# Patient Record
Sex: Female | Born: 1955
Health system: Southern US, Community
[De-identification: ages and names within clinical notes are randomized; demographics above are authoritative.]

## PROBLEM LIST (undated history)

## (undated) DIAGNOSIS — G4700 Insomnia, unspecified: Secondary | ICD-10-CM

## (undated) DIAGNOSIS — M779 Enthesopathy, unspecified: Secondary | ICD-10-CM

## (undated) DIAGNOSIS — R609 Edema, unspecified: Secondary | ICD-10-CM

## (undated) DIAGNOSIS — Z9109 Other allergy status, other than to drugs and biological substances: Secondary | ICD-10-CM

## (undated) DIAGNOSIS — E509 Vitamin A deficiency, unspecified: Secondary | ICD-10-CM

## (undated) DIAGNOSIS — B029 Zoster without complications: Secondary | ICD-10-CM

## (undated) DIAGNOSIS — N959 Unspecified menopausal and perimenopausal disorder: Secondary | ICD-10-CM

## (undated) DIAGNOSIS — M810 Age-related osteoporosis without current pathological fracture: Secondary | ICD-10-CM

## (undated) DIAGNOSIS — E669 Obesity, unspecified: Secondary | ICD-10-CM

## (undated) DIAGNOSIS — R7303 Prediabetes: Secondary | ICD-10-CM

## (undated) DIAGNOSIS — T7840XA Allergy, unspecified, initial encounter: Secondary | ICD-10-CM

## (undated) HISTORY — DX: Enthesopathy, unspecified: M77.9

## (undated) HISTORY — DX: Age-related osteoporosis without current pathological fracture: M81.0

## (undated) HISTORY — DX: Allergy, unspecified, initial encounter: T78.40XA

## (undated) HISTORY — DX: Prediabetes: R73.03

## (undated) HISTORY — DX: Vitamin a deficiency, unspecified: E50.9

## (undated) HISTORY — PX: ABDOMINAL HYSTERECTOMY: SHX81

## (undated) HISTORY — PX: HERNIA REPAIR: SHX51

## (undated) HISTORY — DX: Insomnia, unspecified: G47.00

## (undated) HISTORY — DX: Edema, unspecified: R60.9

## (undated) HISTORY — PX: COLONOSCOPY: SHX174

## (undated) HISTORY — DX: Obesity, unspecified: E66.9

## (undated) HISTORY — DX: Unspecified menopausal and perimenopausal disorder: N95.9

---

## 2004-07-26 ENCOUNTER — Ambulatory Visit: Payer: Self-pay | Admitting: Unknown Physician Specialty

## 2004-09-17 LAB — HM COLONOSCOPY: HM COLON: NORMAL

## 2004-11-08 ENCOUNTER — Ambulatory Visit: Payer: Self-pay

## 2005-08-21 ENCOUNTER — Ambulatory Visit: Payer: Self-pay

## 2006-09-11 ENCOUNTER — Ambulatory Visit: Payer: Self-pay

## 2007-04-02 DIAGNOSIS — R9431 Abnormal electrocardiogram [ECG] [EKG]: Secondary | ICD-10-CM | POA: Insufficient documentation

## 2007-09-16 ENCOUNTER — Ambulatory Visit: Payer: Self-pay | Admitting: Family Medicine

## 2007-09-18 HISTORY — PX: BREAST EXCISIONAL BIOPSY: SUR124

## 2008-04-13 ENCOUNTER — Ambulatory Visit: Payer: Self-pay | Admitting: Surgery

## 2008-04-23 ENCOUNTER — Ambulatory Visit: Payer: Self-pay | Admitting: Surgery

## 2008-04-29 ENCOUNTER — Ambulatory Visit: Payer: Self-pay | Admitting: Surgery

## 2009-04-28 ENCOUNTER — Ambulatory Visit: Payer: Self-pay | Admitting: Family Medicine

## 2009-05-17 ENCOUNTER — Ambulatory Visit: Payer: Self-pay | Admitting: Family Medicine

## 2010-03-17 LAB — HM DEXA SCAN

## 2010-04-07 DIAGNOSIS — N63 Unspecified lump in unspecified breast: Secondary | ICD-10-CM | POA: Insufficient documentation

## 2010-04-13 ENCOUNTER — Ambulatory Visit: Payer: Self-pay | Admitting: Family Medicine

## 2010-05-03 ENCOUNTER — Ambulatory Visit: Payer: Self-pay | Admitting: Surgery

## 2010-05-18 HISTORY — PX: BREAST SURGERY: SHX581

## 2011-05-15 ENCOUNTER — Ambulatory Visit: Payer: Self-pay | Admitting: Family Medicine

## 2012-04-16 ENCOUNTER — Ambulatory Visit: Payer: Self-pay | Admitting: Family Medicine

## 2013-06-02 ENCOUNTER — Ambulatory Visit: Payer: Self-pay | Admitting: Family Medicine

## 2014-04-28 LAB — HM PAP SMEAR: HM Pap smear: NORMAL

## 2014-04-28 LAB — LIPID PANEL
Cholesterol: 197 mg/dL (ref 0–200)
HDL: 66 mg/dL (ref 35–70)
LDL Cholesterol: 119 mg/dL
Triglycerides: 62 mg/dL (ref 40–160)

## 2014-04-28 LAB — HEMOGLOBIN A1C: Hgb A1c MFr Bld: 5.7 % (ref 4.0–6.0)

## 2014-06-11 ENCOUNTER — Ambulatory Visit: Payer: Self-pay | Admitting: Family Medicine

## 2014-06-11 LAB — HM MAMMOGRAPHY: HM MAMMO: NORMAL

## 2015-03-30 ENCOUNTER — Telehealth: Payer: Self-pay | Admitting: Family Medicine

## 2015-03-30 NOTE — Telephone Encounter (Signed)
Recommended patient go to [facility] for Split in patient ear lobe.

## 2015-03-30 NOTE — Telephone Encounter (Signed)
Requesting a return call. Patient  is asking if you know a doctor that she can see for the split in her ear. Please return call [phone]

## 2015-04-29 ENCOUNTER — Encounter: Payer: Self-pay | Admitting: Family Medicine

## 2015-04-29 DIAGNOSIS — M1710 Unilateral primary osteoarthritis, unspecified knee: Secondary | ICD-10-CM | POA: Insufficient documentation

## 2015-04-29 DIAGNOSIS — G4700 Insomnia, unspecified: Secondary | ICD-10-CM | POA: Insufficient documentation

## 2015-04-29 DIAGNOSIS — E559 Vitamin D deficiency, unspecified: Secondary | ICD-10-CM | POA: Insufficient documentation

## 2015-04-29 DIAGNOSIS — N951 Menopausal and female climacteric states: Secondary | ICD-10-CM | POA: Insufficient documentation

## 2015-04-29 DIAGNOSIS — J302 Other seasonal allergic rhinitis: Secondary | ICD-10-CM | POA: Insufficient documentation

## 2015-04-29 DIAGNOSIS — J3089 Other allergic rhinitis: Secondary | ICD-10-CM

## 2015-04-29 DIAGNOSIS — R7303 Prediabetes: Secondary | ICD-10-CM | POA: Insufficient documentation

## 2015-04-29 DIAGNOSIS — E669 Obesity, unspecified: Secondary | ICD-10-CM | POA: Insufficient documentation

## 2015-04-29 DIAGNOSIS — R600 Localized edema: Secondary | ICD-10-CM | POA: Insufficient documentation

## 2015-05-02 ENCOUNTER — Encounter (INDEPENDENT_AMBULATORY_CARE_PROVIDER_SITE_OTHER): Payer: Self-pay

## 2015-05-02 ENCOUNTER — Encounter: Payer: Self-pay | Admitting: Family Medicine

## 2015-05-02 ENCOUNTER — Ambulatory Visit (INDEPENDENT_AMBULATORY_CARE_PROVIDER_SITE_OTHER): Payer: BLUE CROSS/BLUE SHIELD | Admitting: Family Medicine

## 2015-05-02 VITALS — BP 116/60 | HR 73 | Temp 98.1°F | Resp 14 | Ht 64.0 in | Wt 193.2 lb

## 2015-05-02 DIAGNOSIS — Z0000 Encounter for general adult medical examination without abnormal findings: Secondary | ICD-10-CM | POA: Diagnosis not present

## 2015-05-02 DIAGNOSIS — Z114 Encounter for screening for human immunodeficiency virus [HIV]: Secondary | ICD-10-CM

## 2015-05-02 DIAGNOSIS — Z7189 Other specified counseling: Secondary | ICD-10-CM | POA: Diagnosis not present

## 2015-05-02 DIAGNOSIS — M2012 Hallux valgus (acquired), left foot: Secondary | ICD-10-CM

## 2015-05-02 DIAGNOSIS — Z90710 Acquired absence of both cervix and uterus: Secondary | ICD-10-CM | POA: Insufficient documentation

## 2015-05-02 DIAGNOSIS — R7303 Prediabetes: Secondary | ICD-10-CM

## 2015-05-02 DIAGNOSIS — M25551 Pain in right hip: Secondary | ICD-10-CM | POA: Diagnosis not present

## 2015-05-02 DIAGNOSIS — R7309 Other abnormal glucose: Secondary | ICD-10-CM | POA: Diagnosis not present

## 2015-05-02 DIAGNOSIS — Z1211 Encounter for screening for malignant neoplasm of colon: Secondary | ICD-10-CM | POA: Diagnosis not present

## 2015-05-02 DIAGNOSIS — R600 Localized edema: Secondary | ICD-10-CM | POA: Diagnosis not present

## 2015-05-02 DIAGNOSIS — Z13220 Encounter for screening for lipoid disorders: Secondary | ICD-10-CM | POA: Diagnosis not present

## 2015-05-02 DIAGNOSIS — M21612 Bunion of left foot: Secondary | ICD-10-CM | POA: Insufficient documentation

## 2015-05-02 DIAGNOSIS — Z1239 Encounter for other screening for malignant neoplasm of breast: Secondary | ICD-10-CM

## 2015-05-02 DIAGNOSIS — E559 Vitamin D deficiency, unspecified: Secondary | ICD-10-CM

## 2015-05-02 DIAGNOSIS — Z719 Counseling, unspecified: Secondary | ICD-10-CM

## 2015-05-02 DIAGNOSIS — Z01419 Encounter for gynecological examination (general) (routine) without abnormal findings: Secondary | ICD-10-CM

## 2015-05-02 NOTE — Progress Notes (Signed)
Name: Kathy Benjamin   MRN: [id number]    DOB: 05/30/1956   Date:05/02/2015       Progress Note  Subjective  Chief Complaint  Chief Complaint  Patient presents with  . Annual Exam  . Hip Pain    right onset several months ago     HPI  Well Woman Exam: doing well, except for noticing right hip pain that is triggered by working and walking, but symptoms are not daily, improves with Aleve. She also has noticed bunion on the left foot  Patient Active Problem List   Diagnosis Date Noted  . Insomnia 04/29/2015  . Edema leg 04/29/2015  . Obesity (BMI 30.0-34.9) 04/29/2015  . Primary localized osteoarthrosis, lower leg 04/29/2015  . Perennial allergic rhinitis with seasonal variation 04/29/2015  . Borderline diabetes 04/29/2015  . Menopausal symptom 04/29/2015  . Vitamin D deficiency 04/29/2015  . Abnormal electrocardiogram 04/02/2007    Past Surgical History  Procedure Laterality Date  . Abdominal hysterectomy    . Breast surgery Right [id number]    biopsy  . Hernia repair      umbilical    Family History  Problem Relation Age of Onset  . Diabetes Mother   . Hypertension Mother   . Stroke Father   . Hypertension Father   . Cancer Father   . Hyperthyroidism Sister   . Cancer Brother     prostate  . Hyperthyroidism Brother     Social History   Social History  . Marital Status: Single    Spouse Name: N/A  . Number of Children: N/A  . Years of Education: N/A   Occupational History  . Not on file.   Social History Main Topics  . Smoking status: Never Smoker   . Smokeless tobacco: Never Used  . Alcohol Use: No  . Drug Use: No  . Sexual Activity: Not Currently   Other Topics Concern  . Not on file   Social History Narrative     Current outpatient prescriptions:  .  Biotin (BIOTIN 5000) 5 MG CAPS, Take 1 tablet by mouth daily., Disp: , Rfl:  .  BOSWELLIA-GLUCOSAMINE-VIT D PO, Take 1 tablet by mouth daily., Disp: , Rfl:  .  Calcium Carbonate-Vitamin D  (CALCIUM 600-D) 600-400 MG-UNIT per tablet, Take 1 tablet by mouth daily., Disp: , Rfl:  .  fluticasone (FLONASE) 50 MCG/ACT nasal spray, Place 2 sprays into both nostrils daily., Disp: , Rfl: 5 .  ibuprofen (ADVIL,MOTRIN) 800 MG tablet, Take 1 tablet by mouth as needed., Disp: , Rfl:  .  loratadine (CLARITIN) 10 MG tablet, Take 1 tablet by mouth as needed., Disp: , Rfl:  .  montelukast (SINGULAIR) 10 MG tablet, Take 1 tablet by mouth as needed., Disp: , Rfl:   No Known Allergies   ROS  Constitutional: Negative for fever or weight change.  Respiratory: Negative for cough and shortness of breath.   Cardiovascular: Negative for chest pain or palpitations.  Gastrointestinal: Negative for abdominal pain, no bowel changes.  Musculoskeletal: Negative for gait problem or joint swelling.  Skin: Negative for rash.  Neurological: Negative for dizziness or headache.  No other specific complaints in a complete review of systems (except as listed in HPI above).  Objective  Filed Vitals:   05/02/15 0833  BP: 116/60  Pulse: 73  Temp: 98.1 F (36.7 C)  TempSrc: Oral  Resp: 14  Height:  (1.626 m)  Weight: 193 lb 3.2 oz (87.635 kg)  SpO2: 97%  Body mass index is 33.15 kg/(m^2).  Physical Exam  Constitutional: Patient appears well-developed and well-nourished. No distress.  HENT: Head: Normocephalic and atraumatic. Ears: B TMs ok, no erythema or effusion; she has a ripped ear lobe from earring use, seeing ENT next week.  Nose: Nose normal. Mouth/Throat: Oropharynx is clear and moist. No oropharyngeal exudate.  Eyes: Conjunctivae and EOM are normal. Pupils are equal, round, and reactive to light. No scleral icterus.  Neck: Normal range of motion. Neck supple. No JVD present. No thyromegaly present.  Cardiovascular: Normal rate, regular rhythm and normal heart sounds.  No murmur heard. No BLE edema. Pulmonary/Chest: Effort normal and breath sounds normal. No respiratory  distress. Abdominal: Soft. Bowel sounds are normal, no distension. There is no tenderness. no masses Breast: scar on right upper breast, both breasts have some breast tissue thickening but no true lump felt, no nipple discharge or rashes FEMALE GENITALIA:  External genitalia normal External urethra normal Vaginal vault normal without discharge or lesions RECTAL:not done Musculoskeletal: Pain with external rotation of right hip,  no joint effusions. Bunion left foot Neurological: he is alert and oriented to person, place, and time. No cranial nerve deficit. Coordination, balance, strength, speech and gait are normal.  Skin: Skin is warm and dry. No rash noted. No erythema.  Psychiatric: Patient has a normal mood and affect. behavior is normal. Judgment and thought content normal.   PHQ2/9: Depression screen PHQ 2/9 05/02/2015  Decreased Interest 0  Down, Depressed, Hopeless 0  PHQ - 2 Score 0    Fall Risk: Fall Risk  05/02/2015  Falls in the past year? No     Assessment & Plan  1. Well woman exam   2. Health counseling Discussed importance of 150 minutes of physical activity weekly, eat two servings of fish weekly, eat one serving of tree nuts ( cashews, pistachios, pecans, almonds.[name]) every other day, eat 6 servings of fruit/vegetables daily and drink plenty of water and avoid sweet beverages.   3. Bilateral edema of lower extremity Stable, check labs - Comprehensive metabolic panel - CBC with Differential/Platelet  4. Borderline diabetes  - Hemoglobin A1c  5. Vitamin D deficiency  - Vit D  25 hydroxy (rtn osteoporosis monitoring)  6. Encounter for screening for HIV  - HIV antibody  7. Lipid screening  - Lipid panel  8. Breast cancer screening  - MM Digital Screening; Future  9. Colon cancer screening  - Ambulatory referral to Gastroenterology  10. Bunion of left foot Reassurance, not painful  11. Right hip pain Advised to avoid NSAID's and try Tylenol  instead for pain, likely OA, if symptoms gets worse we will check X-ray of hip

## 2015-05-02 NOTE — Patient Instructions (Signed)
Discussed importance of 150 minutes of physical activity weekly, eat two servings of fish weekly, eat one serving of tree nuts ( cashews, pistachios, pecans, almonds..) every other day, eat 6 servings of fruit/vegetables daily and drink plenty of water and avoid sweet beverages. 

## 2015-05-03 LAB — CBC WITH DIFFERENTIAL/PLATELET
BASOS: 1 %
Basophils Absolute: 0 10*3/uL (ref 0.0–0.2)
EOS (ABSOLUTE): 0.1 10*3/uL (ref 0.0–0.4)
Eos: 3 %
Hematocrit: 39.8 % (ref 34.0–46.6)
Hemoglobin: 12.9 g/dL (ref 11.1–15.9)
IMMATURE GRANS (ABS): 0 10*3/uL (ref 0.0–0.1)
IMMATURE GRANULOCYTES: 0 %
LYMPHS: 47 %
Lymphocytes Absolute: 2 10*3/uL (ref 0.7–3.1)
MCH: 29.7 pg (ref 26.6–33.0)
MCHC: 32.4 g/dL (ref 31.5–35.7)
MCV: 92 fL (ref 79–97)
Monocytes Absolute: 0.5 10*3/uL (ref 0.1–0.9)
Monocytes: 13 %
NEUTROS PCT: 36 %
Neutrophils Absolute: 1.4 10*3/uL (ref 1.4–7.0)
PLATELETS: 197 10*3/uL (ref 150–379)
RBC: 4.34 x10E6/uL (ref 3.77–5.28)
RDW: 13.5 % (ref 12.3–15.4)
WBC: 4.1 10*3/uL (ref 3.4–10.8)

## 2015-05-03 LAB — LIPID PANEL
CHOL/HDL RATIO: 2.9 ratio (ref 0.0–4.4)
Cholesterol, Total: 183 mg/dL (ref 100–199)
HDL: 64 mg/dL (ref >39)
LDL CALC: 102 mg/dL — AB (ref 0–99)
Triglycerides: 85 mg/dL (ref 0–149)
VLDL Cholesterol Cal: 17 mg/dL (ref 5–40)

## 2015-05-03 LAB — COMPREHENSIVE METABOLIC PANEL
A/G RATIO: 1.9 (ref 1.1–2.5)
ALT: 12 IU/L (ref 0–32)
AST: 16 IU/L (ref 0–40)
Albumin: 4.2 g/dL (ref 3.5–5.5)
Alkaline Phosphatase: 31 IU/L — ABNORMAL LOW (ref 39–117)
BUN/Creatinine Ratio: 19 (ref 9–23)
BUN: 13 mg/dL (ref 6–24)
Bilirubin Total: 1 mg/dL (ref 0.0–1.2)
CALCIUM: 9.3 mg/dL (ref 8.7–10.2)
CO2: 27 mmol/L (ref 18–29)
CREATININE: 0.68 mg/dL (ref 0.57–1.00)
Chloride: 104 mmol/L (ref 97–108)
GFR calc Af Amer: 112 mL/min/{1.73_m2} (ref >59)
GFR, EST NON AFRICAN AMERICAN: 97 mL/min/{1.73_m2} (ref >59)
Globulin, Total: 2.2 g/dL (ref 1.5–4.5)
Glucose: 90 mg/dL (ref 65–99)
POTASSIUM: 4.4 mmol/L (ref 3.5–5.2)
Sodium: 145 mmol/L — ABNORMAL HIGH (ref 134–144)
Total Protein: 6.4 g/dL (ref 6.0–8.5)

## 2015-05-03 LAB — VITAMIN D 25 HYDROXY (VIT D DEFICIENCY, FRACTURES): VIT D 25 HYDROXY: 33.1 ng/mL (ref 30.0–100.0)

## 2015-05-03 LAB — HIV ANTIBODY (ROUTINE TESTING W REFLEX): HIV SCREEN 4TH GENERATION: NONREACTIVE

## 2015-05-03 LAB — HEMOGLOBIN A1C
Est. average glucose Bld gHb Est-mCnc: 120 mg/dL
Hgb A1c MFr Bld: 5.8 % — ABNORMAL HIGH (ref 4.8–5.6)

## 2015-05-04 NOTE — Progress Notes (Signed)
Patient notified

## 2015-05-30 ENCOUNTER — Telehealth: Payer: Self-pay | Admitting: Gastroenterology

## 2015-05-30 NOTE — Telephone Encounter (Signed)
Please call patient for colonoscopy triage. Received referral from Dr Carlynn Purl on 05/02/15 - we attempted to call patient on 05/05/15 to do colonoscopy screening, but there was no answer and unable to leave a voicemail. Patient called today. She requests, if possible, to do her colonoscopy screening today and to call her at her work number [phone] because she is unable to use her cell phone at work. Thanks

## 2015-05-31 ENCOUNTER — Telehealth: Payer: Self-pay | Admitting: Gastroenterology

## 2015-05-31 ENCOUNTER — Other Ambulatory Visit: Payer: Self-pay

## 2015-05-31 NOTE — Telephone Encounter (Signed)
Gastroenterology Pre-Procedure Review  Request Date: 07-15-2015 Requesting Physician: Dr.Sowles  PATIENT REVIEW QUESTIONS: The patient responded to the following health history questions as indicated:    1. Are you having any GI issues? no 2. Do you have a personal history of Polyps? no 3. Do you have a family history of Colon Cancer or Polyps? no 4. Diabetes Mellitus? no 5. Joint replacements in the past 12 months?no 6. Major health problems in the past 3 months?no 7. Any artificial heart valves, MVP, or defibrillator?no    MEDICATIONS & ALLERGIES:    Patient reports the following regarding taking any anticoagulation/antiplatelet therapy:   Plavix, Coumadin, Eliquis, Xarelto, Lovenox, Pradaxa, Brilinta, or Effient? no Aspirin? no  Patient confirms/reports the following medications:  Current Outpatient Prescriptions  Medication Sig Dispense Refill   Biotin (BIOTIN 5000) 5 MG CAPS Take 1 tablet by mouth daily.     BOSWELLIA-GLUCOSAMINE-VIT D PO Take 1 tablet by mouth daily.     Calcium Carbonate-Vitamin D (CALCIUM 600-D) 600-400 MG-UNIT per tablet Take 1 tablet by mouth daily.     fluticasone (FLONASE) 50 MCG/ACT nasal spray Place 2 sprays into both nostrils daily.  5   ibuprofen (ADVIL,MOTRIN) 800 MG tablet Take 1 tablet by mouth as needed.     loratadine (CLARITIN) 10 MG tablet Take 1 tablet by mouth as needed.     montelukast (SINGULAIR) 10 MG tablet Take 1 tablet by mouth as needed.     No current facility-administered medications for this visit.    Patient confirms/reports the following allergies:  No Known Allergies  No orders of the defined types were placed in this encounter.    AUTHORIZATION INFORMATION Primary Insurance: 1D#: Group #:  Secondary Insurance: 1D#: Group #:  SCHEDULE INFORMATION: Date:  Time: Location:

## 2015-05-31 NOTE — Telephone Encounter (Signed)
Colon 07-15-2015 [facility] insurance

## 2015-06-13 ENCOUNTER — Ambulatory Visit
Admission: RE | Admit: 2015-06-13 | Discharge: 2015-06-13 | Disposition: A | Discharge status: Home / Self Care | Payer: BLUE CROSS/BLUE SHIELD | Source: Ambulatory Visit | Attending: Family Medicine | Admitting: Family Medicine

## 2015-06-13 DIAGNOSIS — Z1239 Encounter for other screening for malignant neoplasm of breast: Secondary | ICD-10-CM

## 2015-06-13 DIAGNOSIS — Z1231 Encounter for screening mammogram for malignant neoplasm of breast: Secondary | ICD-10-CM | POA: Diagnosis not present

## 2015-07-14 DIAGNOSIS — B029 Zoster without complications: Secondary | ICD-10-CM

## 2015-07-14 HISTORY — DX: Zoster without complications: B02.9

## 2015-07-19 ENCOUNTER — Telehealth: Payer: Self-pay | Admitting: Family Medicine

## 2015-07-19 NOTE — Telephone Encounter (Signed)
Patient is requesting a return call. States she has a rash on the back of her neck. Would like to know if she is able to take benadryl or if we are able to work her in this afternoon around 330

## 2015-07-20 ENCOUNTER — Encounter: Payer: Self-pay | Admitting: Family Medicine

## 2015-07-20 ENCOUNTER — Ambulatory Visit (INDEPENDENT_AMBULATORY_CARE_PROVIDER_SITE_OTHER): Payer: BLUE CROSS/BLUE SHIELD | Admitting: Family Medicine

## 2015-07-20 ENCOUNTER — Other Ambulatory Visit: Payer: Self-pay | Admitting: Family Medicine

## 2015-07-20 VITALS — BP 108/56 | HR 99 | Temp 99.5°F | Resp 16 | Ht 63.0 in | Wt 192.3 lb

## 2015-07-20 DIAGNOSIS — B029 Zoster without complications: Secondary | ICD-10-CM

## 2015-07-20 MED ORDER — VALACYCLOVIR HCL 1 G PO TABS: 1000.0000 mg | ORAL_TABLET | Freq: Three times a day (TID) | ORAL | Status: DC

## 2015-07-20 MED ORDER — HYDROCODONE-ACETAMINOPHEN 10-325 MG PO TABS: 1.0000 | ORAL_TABLET | Freq: Four times a day (QID) | ORAL | Status: DC | PRN

## 2015-07-20 NOTE — Progress Notes (Signed)
Name: Kathy HockeyDorothy Ann Hazelrigg   MRN: [id number]    DOB: 29-Jun-1956   Date:07/20/2015       Progress Note  Subjective  Chief Complaint  Chief Complaint  Patient presents with  . Rash    onset monday thought something bit her now her whole side of right neck is broke out into hair line.  Patient declines any itching or pain.  She does states she has a tightness in her neck.  blister like and raised    HPI  Shingles: she developed an itchy spot behind right ear 3 days ago. She thought it was a insect bite. The rash has spread to right side of neck and nuchal area, it is not painful, but itchy and some burning. Neck is feeling stiff and tender sometimes. She denies fever, she is feeling tired. No jaw pain, no change in appetite.   Patient Active Problem List   Diagnosis Date Noted  . Bunion of left foot 05/02/2015  . H/O: hysterectomy 05/02/2015  . Insomnia 04/29/2015  . Edema leg 04/29/2015  . Obesity (BMI 30.0-34.9) 04/29/2015  . Primary localized osteoarthrosis, lower leg 04/29/2015  . Perennial allergic rhinitis with seasonal variation 04/29/2015  . Borderline diabetes 04/29/2015  . Menopausal symptom 04/29/2015  . Vitamin D deficiency 04/29/2015  . Abnormal electrocardiogram 04/02/2007    Past Surgical History  Procedure Laterality Date  . Abdominal hysterectomy    . Breast surgery Right [id number]    biopsy  . Hernia repair      umbilical  . Breast biopsy Right 2009    neg    Family History  Problem Relation Age of Onset  . Diabetes Mother   . Hypertension Mother   . Stroke Father   . Hypertension Father   . Cancer Father   . Hyperthyroidism Sister   . Cancer Brother     prostate  . Hyperthyroidism Brother     Social History   Social History  . Marital Status: Single    Spouse Name: N/A  . Number of Children: N/A  . Years of Education: N/A   Occupational History  . Not on file.   Social History Main Topics  . Smoking status: Never Smoker   . Smokeless  tobacco: Never Used  . Alcohol Use: No  . Drug Use: No  . Sexual Activity: Not Currently   Other Topics Concern  . Not on file   Social History Narrative     Current outpatient prescriptions:  .  Biotin (BIOTIN 5000) 5 MG CAPS, Take 1 tablet by mouth daily., Disp: , Rfl:  .  BOSWELLIA-GLUCOSAMINE-VIT D PO, Take 1 tablet by mouth daily., Disp: , Rfl:  .  Calcium Carbonate-Vitamin D (CALCIUM 600-D) 600-400 MG-UNIT per tablet, Take 1 tablet by mouth daily., Disp: , Rfl:  .  fluticasone (FLONASE) 50 MCG/ACT nasal spray, Place 2 sprays into both nostrils daily., Disp: , Rfl: 5 .  HYDROcodone-acetaminophen (NORCO) 10-325 MG tablet, Take 1 tablet by mouth every 6 (six) hours as needed., Disp: 20 tablet, Rfl: 0 .  ibuprofen (ADVIL,MOTRIN) 800 MG tablet, Take 1 tablet by mouth as needed., Disp: , Rfl:  .  loratadine (CLARITIN) 10 MG tablet, Take 1 tablet by mouth as needed., Disp: , Rfl:  .  montelukast (SINGULAIR) 10 MG tablet, Take 1 tablet by mouth as needed., Disp: , Rfl:  .  valACYclovir (VALTREX) 1000 MG tablet, Take 1 tablet (1,000 mg total) by mouth 3 (three) times daily., Disp: 21 tablet, Rfl:  0  No Known Allergies   ROS  Ten systems reviewed and is negative except as mentioned in HPI   Objective  Filed Vitals:   07/20/15 1005  BP: 108/56  Pulse: 99  Temp: 99.5 F (37.5 C)  TempSrc: Oral  Resp: 16  Height: 5\' 3"  (1.6 m)  Weight: 192 lb 4.8 oz (87.227 kg)  SpO2: 97%    Body mass index is 34.07 kg/(m^2).  Physical Exam  Constitutional: Patient appears well-developed and well-nourished. Obese No distress.  HEENT: head atraumatic, normocephalic, pupils equal and reactive to light, ears normal bilaterally, neck supple, throat within normal limits Cardiovascular: Normal rate, regular rhythm and normal heart sounds.  No murmur heard. No BLE edema. Pulmonary/Chest: Effort normal and breath sounds normal. No respiratory distress. Abdominal: Soft.  There is no  tenderness. Psychiatric: Patient has a normal mood and affect. behavior is normal. Judgment and thought content normal. Skin: erythematous papular rash on right side of neck and nuchal area, some Blisters on posterior aspect - towards nuchal area, no oozing.   Recent Results (from the past 2160 hour(s))  HIV antibody     Status: None   Collection Time: 05/02/15  9:57 AM  Result Value Ref Range   HIV Screen 4th Generation wRfx Non Reactive Non Reactive  Vit D  25 hydroxy (rtn osteoporosis monitoring)     Status: None   Collection Time: 05/02/15  9:57 AM  Result Value Ref Range   Vit D, 25-Hydroxy 33.1 30.0 - 100.0 ng/mL    Comment: Vitamin D deficiency has been defined by the Institute of Medicine and an Endocrine Society practice guideline as a level of serum 25-OH vitamin D less than 20 ng/mL (1,2). The Endocrine Society went on to further define vitamin D insufficiency as a level between 21 and 29 ng/mL (2). 1. IOM (Institute of Medicine). 2010. Dietary reference    intakes for calcium and D. [place]: The    [employer]. 2. Holick MF, [place], Bischoff-Ferrari HA, et al.    Evaluation, treatment, and prevention of vitamin D    deficiency: an Endocrine Society clinical practice    guideline. JCEM. 2011 Jul; 96(7):1911-30.   Hemoglobin A1c     Status: Abnormal   Collection Time: 05/02/15  9:57 AM  Result Value Ref Range   Hgb A1c MFr Bld 5.8 (H) 4.8 - 5.6 %    Comment:          Pre-diabetes: 5.7 - 6.4          Diabetes: >6.4          Glycemic control for adults with diabetes: <7.0    Est. average glucose Bld gHb Est-mCnc 120 mg/dL  Lipid panel     Status: Abnormal   Collection Time: 05/02/15  9:57 AM  Result Value Ref Range   Cholesterol, Total 183 100 - 199 mg/dL   Triglycerides 85 0 - 149 mg/dL   HDL 64 >72 mg/dL    Comment: According to ATP-III Guidelines, HDL-C >59 mg/dL is considered a negative risk factor for CHD.    VLDL Cholesterol Cal 17 5 -  40 mg/dL   LDL Calculated 536 (H) 0 - 99 mg/dL   Chol/HDL Ratio 2.9 0.0 - 4.4 ratio units    Comment:                                   T.  Chol/HDL Ratio                                             Men  Women                               1/2 Avg.Risk  3.4    3.3                                   Avg.Risk  5.0    4.4                                2X Avg.Risk  9.6    7.1                                3X Avg.Risk 23.4   11.0   Comprehensive metabolic panel     Status: Abnormal   Collection Time: 05/02/15  9:57 AM  Result Value Ref Range   Glucose 90 65 - 99 mg/dL   BUN 13 6 - 24 mg/dL   Creatinine, Ser 9.60 0.57 - 1.00 mg/dL   GFR calc non Af Amer 97 >59 mL/min/1.73   GFR calc Af Amer 112 >59 mL/min/1.73   BUN/Creatinine Ratio 19 9 - 23   Sodium 145 (H) 134 - 144 mmol/L   Potassium 4.4 3.5 - 5.2 mmol/L   Chloride 104 97 - 108 mmol/L   CO2 27 18 - 29 mmol/L   Calcium 9.3 8.7 - 10.2 mg/dL   Total Protein 6.4 6.0 - 8.5 g/dL   Albumin 4.2 3.5 - 5.5 g/dL   Globulin, Total 2.2 1.5 - 4.5 g/dL   Albumin/Globulin Ratio 1.9 1.1 - 2.5   Bilirubin Total 1.0 0.0 - 1.2 mg/dL   Alkaline Phosphatase 31 (L) 39 - 117 IU/L   AST 16 0 - 40 IU/L   ALT 12 0 - 32 IU/L  CBC with Differential/Platelet     Status: None   Collection Time: 05/02/15  9:57 AM  Result Value Ref Range   WBC 4.1 3.4 - 10.8 x10E3/uL   RBC 4.34 3.77 - 5.28 x10E6/uL   Hemoglobin 12.9 11.1 - 15.9 g/dL   Hematocrit 45.4 09.8 - 46.6 %   MCV 92 79 - 97 fL   MCH 29.7 26.6 - 33.0 pg   MCHC 32.4 31.5 - 35.7 g/dL   RDW 11.9 14.7 - 82.9 %   Platelets 197 150 - 379 x10E3/uL   Neutrophils 36 %   Lymphs 47 %   Monocytes 13 %   Eos 3 %   Basos 1 %   Neutrophils Absolute 1.4 1.4 - 7.0 x10E3/uL   Lymphocytes Absolute 2.0 0.7 - 3.1 x10E3/uL   Monocytes Absolute 0.5 0.1 - 0.9 x10E3/uL   EOS (ABSOLUTE) 0.1 0.0 - 0.4 x10E3/uL   Basophils Absolute 0.0 0.0 - 0.2 x10E3/uL   Immature Granulocytes 0 %   Immature Grans (Abs) 0.0 0.0 - 0.1  x10E3/uL    PHQ2/9: Depression screen [facility] 2/9 07/20/2015 05/02/2015  Decreased Interest 0 0  Down, Depressed, Hopeless 0 0  PHQ - 2 Score 0 0  Fall Risk: Fall Risk  07/20/2015 05/02/2015  Falls in the past year? No No    Functional Status Survey: Is the patient deaf or have difficulty hearing?: No Does the patient have difficulty seeing, even when wearing glasses/contacts?: Yes (glasses) Does the patient have difficulty concentrating, remembering, or making decisions?: No Does the patient have difficulty walking or climbing stairs?: No Does the patient have difficulty dressing or bathing?: No Does the patient have difficulty doing errands alone such as visiting a doctor's office or shopping?: No    Assessment & Plan  1. Shingles outbreak  We will treat with Valtrex, pain medication to take prn . Discussed importance of avoiding visiting newborns or anyone that is immunosuppressed. Shingles shot in 5  years - valACYclovir (VALTREX) 1000 MG tablet; Take 1 tablet (1,000 mg total) by mouth 3 (three) times daily.  Dispense: 21 tablet; Refill: 0 - HYDROcodone-acetaminophen (NORCO) 10-325 MG tablet; Take 1 tablet by mouth every 6 (six) hours as needed.  Dispense: 20 tablet; Refill: 0

## 2015-07-20 NOTE — Telephone Encounter (Signed)
Patient was notified and is coming in for a appointment today.

## 2015-07-20 NOTE — Patient Instructions (Signed)

## 2015-07-25 ENCOUNTER — Telehealth: Payer: Self-pay | Admitting: Family Medicine

## 2015-07-25 NOTE — Telephone Encounter (Signed)
Was diagnosed with shingles on 07-20-15 and would like to know if it was okay to put any cream on her face. Please return call [phone]

## 2015-07-26 NOTE — Telephone Encounter (Signed)
Cream for pain? What is exactly her question? What type of cream?

## 2015-07-27 ENCOUNTER — Telehealth: Payer: Self-pay

## 2015-07-27 NOTE — Telephone Encounter (Signed)
Left voicemial to call us back

## 2015-07-27 NOTE — Telephone Encounter (Signed)
Patient stated cortisone cream and I told her that would be fine.

## 2015-07-27 NOTE — Telephone Encounter (Signed)
Dr. Carlynn PurlSowles, please advise if you think your pt will still be able to have her colonoscopy on 08/05/15 because of her shingles. If you feel we should put this off a few more weeks, just let me know and I will call her. Thank you!

## 2015-07-27 NOTE — Telephone Encounter (Signed)
Patient has a colonoscopy scheduled for 08/05/2015. However, she stated that she spoke to you about her having Shingles. She stated that she is done with her antibiotics but wanted to know if she is okay to proceed with the colonoscopy or not. She wanted for you to call her for further questions.  Patient also wanted to know if we could mail her the instructions for prepping since she she had lost the one that was given. I told her that I would.

## 2015-07-28 ENCOUNTER — Encounter: Payer: Self-pay | Admitting: *Deleted

## 2015-07-28 NOTE — Telephone Encounter (Signed)
She should be able to have it done - as long as all lesion have crusted over

## 2015-07-29 NOTE — Telephone Encounter (Addendum)
Spoke with pt and advised her of what Dr. Carlynn PurlSowles had said. She will keep her appt on 08/05/15, but call me back on Wednesday, Nov 16th to let me know how the area looks. Will continue Cortisone cream as directed by Dr. Carlynn PurlSowles.  If area is not crusted over then we will change appt.

## 2015-07-29 NOTE — Telephone Encounter (Signed)
LVM for Kathy Benjamin to return my call on Kathy Benjamin's home and cell phone regarding Dr Carlynn PurlSowles recommendations. Asked Kathy Benjamin to return my call.

## 2015-08-04 ENCOUNTER — Telehealth: Payer: Self-pay | Admitting: Family Medicine

## 2015-08-04 MED ORDER — ACYCLOVIR 5 % EX OINT: 1.0000 "application " | TOPICAL_OINTMENT | CUTANEOUS | Status: DC

## 2015-08-04 NOTE — Discharge Instructions (Signed)

## 2015-08-04 NOTE — Telephone Encounter (Signed)
Was diagnosed with shingles and they are getting better however she is still doing a lot of itching. Would like to know what can she do to calm it down. She has tried cortizone cream. Please reroute to one of the other ladies I will be off tomorrow.

## 2015-08-04 NOTE — Telephone Encounter (Signed)
Sent prescription for antiviral cream

## 2015-08-05 ENCOUNTER — Ambulatory Visit: Payer: BLUE CROSS/BLUE SHIELD | Admitting: Anesthesiology

## 2015-08-05 ENCOUNTER — Encounter
Admission: RE | Disposition: A | Discharge status: Home / Self Care | Payer: Self-pay | Source: Ambulatory Visit | Attending: Gastroenterology

## 2015-08-05 ENCOUNTER — Ambulatory Visit
Admission: RE | Admit: 2015-08-05 | Discharge: 2015-08-05 | Disposition: A | Discharge status: Home / Self Care | Payer: BLUE CROSS/BLUE SHIELD | Source: Ambulatory Visit | Attending: Gastroenterology | Admitting: Gastroenterology

## 2015-08-05 DIAGNOSIS — R7303 Prediabetes: Secondary | ICD-10-CM | POA: Insufficient documentation

## 2015-08-05 DIAGNOSIS — Z833 Family history of diabetes mellitus: Secondary | ICD-10-CM | POA: Diagnosis not present

## 2015-08-05 DIAGNOSIS — Z809 Family history of malignant neoplasm, unspecified: Secondary | ICD-10-CM | POA: Diagnosis not present

## 2015-08-05 DIAGNOSIS — Z780 Asymptomatic menopausal state: Secondary | ICD-10-CM | POA: Insufficient documentation

## 2015-08-05 DIAGNOSIS — Z79899 Other long term (current) drug therapy: Secondary | ICD-10-CM | POA: Insufficient documentation

## 2015-08-05 DIAGNOSIS — E509 Vitamin A deficiency, unspecified: Secondary | ICD-10-CM | POA: Insufficient documentation

## 2015-08-05 DIAGNOSIS — E669 Obesity, unspecified: Secondary | ICD-10-CM | POA: Insufficient documentation

## 2015-08-05 DIAGNOSIS — K641 Second degree hemorrhoids: Secondary | ICD-10-CM | POA: Diagnosis not present

## 2015-08-05 DIAGNOSIS — Z8249 Family history of ischemic heart disease and other diseases of the circulatory system: Secondary | ICD-10-CM | POA: Diagnosis not present

## 2015-08-05 DIAGNOSIS — Z1211 Encounter for screening for malignant neoplasm of colon: Secondary | ICD-10-CM | POA: Diagnosis not present

## 2015-08-05 DIAGNOSIS — Z90710 Acquired absence of both cervix and uterus: Secondary | ICD-10-CM | POA: Diagnosis not present

## 2015-08-05 DIAGNOSIS — M810 Age-related osteoporosis without current pathological fracture: Secondary | ICD-10-CM | POA: Diagnosis not present

## 2015-08-05 HISTORY — DX: Zoster without complications: B02.9

## 2015-08-05 HISTORY — PX: COLONOSCOPY WITH PROPOFOL: SHX5780

## 2015-08-05 HISTORY — DX: Other allergy status, other than to drugs and biological substances: Z91.09

## 2015-08-05 SURGERY — COLONOSCOPY WITH PROPOFOL
Anesthesia: Monitor Anesthesia Care | Wound class: Contaminated

## 2015-08-05 MED ORDER — PROMETHAZINE HCL 25 MG/ML IJ SOLN: 6.2500 mg | INTRAMUSCULAR | Status: DC | PRN

## 2015-08-05 MED ORDER — PROPOFOL 10 MG/ML IV BOLUS
INTRAVENOUS | Status: DC | PRN
  Administered 2015-08-05: 50 mg via INTRAVENOUS
  Administered 2015-08-05: 100 mg via INTRAVENOUS
  Administered 2015-08-05: 30 mg via INTRAVENOUS
  Administered 2015-08-05: 20 mg via INTRAVENOUS
  Administered 2015-08-05: 50 mg via INTRAVENOUS
  Administered 2015-08-05: 30 mg via INTRAVENOUS

## 2015-08-05 MED ORDER — LIDOCAINE HCL (CARDIAC) 20 MG/ML IV SOLN
INTRAVENOUS | Status: DC | PRN
  Administered 2015-08-05: 40 mg via INTRAVENOUS

## 2015-08-05 MED ORDER — LACTATED RINGERS IV SOLN
INTRAVENOUS | Status: DC
  Administered 2015-08-05: 10:00:00 via INTRAVENOUS

## 2015-08-05 SURGICAL SUPPLY — 28 items

## 2015-08-05 NOTE — H&P (Signed)
[facility]  [address] [place], [place] [address] Phone: [phone] Fax : [phone]  Primary Care Physician:  Ruel Favors, MD Primary Gastroenterologist:  Dr. Servando Snare  Pre-Procedure History & Physical: HPI:  Kathy Benjamin is a 59 y.o. female is here for a screening colonoscopy.   Past Medical History  Diagnosis Date  . Insomnia   . Menopausal and perimenopausal disorder   . Obesity   . Allergy   . Pre-diabetes   . Vitamin A deficiency   . Osteoporosis   . Tendonitis   . Edema   . Shingles 07/14/15    right ear and neck.  Completed valtrex. scabbing over  . Environmental allergies     Past Surgical History  Procedure Laterality Date  . Abdominal hysterectomy    . Breast surgery Right [id number]    biopsy  . Hernia repair      umbilical  . Breast biopsy Right 2009    neg  . Colonoscopy      Prior to Admission medications   Medication Sig Start Date End Date Taking? Authorizing Provider  acyclovir ointment (ZOVIRAX) 5 % Apply 1 application topically every 4 (four) hours. 08/04/15  Yes Alba Cory, MD  Biotin (BIOTIN 5000) 5 MG CAPS Take 1 tablet by mouth daily.   Yes Historical Provider, MD  BOSWELLIA-GLUCOSAMINE-VIT D PO Take 1 tablet by mouth daily.   Yes Historical Provider, MD  Calcium Carbonate-Vitamin D (CALCIUM 600-D) 600-400 MG-UNIT per tablet Take 1 tablet by mouth daily. 04/06/09  Yes Historical Provider, MD  fluticasone (FLONASE) 50 MCG/ACT nasal spray Place 2 sprays into both nostrils daily. 04/19/15  Yes Historical Provider, MD  HYDROcodone-acetaminophen (NORCO) 10-325 MG tablet Take 1 tablet by mouth every 6 (six) hours as needed. 07/20/15  Yes Alba Cory, MD  ibuprofen (ADVIL,MOTRIN) 800 MG tablet Take 1 tablet by mouth as needed. 04/28/14  Yes Historical Provider, MD  loratadine (CLARITIN) 10 MG tablet Take 1 tablet by mouth as needed. 01/11/15  Yes Historical Provider, MD  montelukast (SINGULAIR) 10 MG tablet Take 1  tablet by mouth as needed. 01/11/15  Yes Historical Provider, MD  valACYclovir (VALTREX) 1000 MG tablet Take 1 tablet (1,000 mg total) by mouth 3 (three) times daily. 07/20/15   Alba Cory, MD    Allergies as of 05/31/2015  . (No Known Allergies)    Family History  Problem Relation Age of Onset  . Diabetes Mother   . Hypertension Mother   . Stroke Father   . Hypertension Father   . Cancer Father   . Hyperthyroidism Sister   . Cancer Brother     prostate  . Hyperthyroidism Brother     Social History   Social History  . Marital Status: Single    Spouse Name: N/A  . Number of Children: N/A  . Years of Education: N/A   Occupational History  . Not on file.   Social History Main Topics  . Smoking status: Never Smoker   . Smokeless tobacco: Never Used  . Alcohol Use: No  . Drug Use: No  . Sexual Activity: Not Currently   Other Topics Concern  . Not on file   Social History Narrative    Review of Systems: See HPI, otherwise negative ROS  Physical Exam: BP 126/78 mmHg  Pulse 54  Temp(Src) 97.9 F (36.6 C) (Temporal)  Resp 16  Ht  (1.6 m)  Wt 185 lb (83.915 kg)  BMI 32.78 kg/m2  SpO2 100%  General:   Alert,  pleasant and cooperative in NAD Head:  Normocephalic and atraumatic. Neck:  Supple; no masses or thyromegaly. Lungs:  Clear throughout to auscultation.    Heart:  Regular rate and rhythm. Abdomen:  Soft, nontender and nondistended. Normal bowel sounds, without guarding, and without rebound.   Neurologic:  Alert and  oriented x4;  grossly normal neurologically.  Impression/Plan: Kathy Benjamin is now here to undergo a screening colonoscopy.  Risks, benefits, and alternatives regarding colonoscopy have been reviewed with the patient.  Questions have been answered.  All parties agreeable.

## 2015-08-05 NOTE — Anesthesia Postprocedure Evaluation (Signed)
  Anesthesia Post-op Note  Patient: Kathy Benjamin  Procedure(s) Performed: Procedure(s): COLONOSCOPY WITH PROPOFOL (N/A)  Anesthesia type:MAC  Patient location: PACU  Post pain: Pain level controlled  Post assessment: Post-op Vital signs reviewed, Patient's Cardiovascular Status Stable, Respiratory Function Stable, Patent Airway and No signs of Nausea or vomiting  Post vital signs: Reviewed and stable  Last Vitals:  Filed Vitals:   08/05/15 1007  BP:   Pulse: 70  Temp:   Resp: 20    Level of consciousness: awake, alert  and patient cooperative  Complications: No apparent anesthesia complications

## 2015-08-05 NOTE — Transfer of Care (Signed)
Immediate Anesthesia Transfer of Care Note  Patient: Kathy CanavanDorothy Ann Welge  Procedure(s) Performed: Procedure(s): COLONOSCOPY WITH PROPOFOL (N/A)  Patient Location: PACU  Anesthesia Type: MAC  Level of Consciousness: awake, alert  and patient cooperative  Airway and Oxygen Therapy: Patient Spontanous Breathing and Patient connected to supplemental oxygen  Post-op Assessment: Post-op Vital signs reviewed, Patient's Cardiovascular Status Stable, Respiratory Function Stable, Patent Airway and No signs of Nausea or vomiting  Post-op Vital Signs: Reviewed and stable  Complications: No apparent anesthesia complications

## 2015-08-05 NOTE — Telephone Encounter (Signed)
Patient notified

## 2015-08-05 NOTE — Anesthesia Preprocedure Evaluation (Signed)
Anesthesia Evaluation  Patient identified by MRN, date of birth, ID band Patient awake    Reviewed: Allergy & Precautions, NPO status , Patient's Chart, lab work & pertinent test results  Airway Mallampati: II  TM Distance: >3 FB Neck ROM: Full    Dental no notable dental hx.    Pulmonary neg pulmonary ROS,    Pulmonary exam normal breath sounds clear to auscultation       Cardiovascular negative cardio ROS Normal cardiovascular exam Rhythm:Regular Rate:Normal     Neuro/Psych negative neurological ROS  negative psych ROS   GI/Hepatic negative GI ROS, Neg liver ROS,   Endo/Other  negative endocrine ROS  Renal/GU negative Renal ROS  negative genitourinary   Musculoskeletal negative musculoskeletal ROS (+) Arthritis , osteoporosis   Abdominal   Peds negative pediatric ROS (+)  Hematology negative hematology ROS (+)   Anesthesia Other Findings Pt with shingles.  Completed Valtrex 3 weeks ago.  She has localized and dry skin changes.  No fluid apparent.  She is immunocompetent.  We have covered and will proceed with colonoscopy.  Reproductive/Obstetrics negative OB ROS                             Anesthesia Physical Anesthesia Plan  ASA: II  Anesthesia Plan: MAC   Post-op Pain Management:    Induction: Intravenous  Airway Management Planned:   Additional Equipment:   Intra-op Plan:   Post-operative Plan: Extubation in OR  Informed Consent: I have reviewed the patients History and Physical, chart, labs and discussed the procedure including the risks, benefits and alternatives for the proposed anesthesia with the patient or authorized representative who has indicated his/her understanding and acceptance.   Dental advisory given  Plan Discussed with: CRNA  Anesthesia Plan Comments:         Anesthesia Quick Evaluation

## 2015-08-05 NOTE — Op Note (Signed)
[facility] Patient Name: Kathy Benjamin Procedure Date: 08/05/2015 9:40 AM MRN: [id number] Account #: [id number] Date of Birth: 1955/09/23 Admit Type: Outpatient Age: 59 Room: [facility] OR ROOM 01 Gender: Female Note Status: Finalized Procedure:         Colonoscopy Indications:       Screening for colorectal malignant neoplasm Providers:         Midge Minium, MD Referring MD:      Onnie Boer. Sowles, MD (Referring MD) Medicines:         Propofol per Anesthesia Complications:     No immediate complications. Procedure:         Pre-Anesthesia Assessment:                    - Prior to the procedure, a History and Physical was                     performed, and patient medications and allergies were                     reviewed. The patient's tolerance of previous anesthesia                     was also reviewed. The risks and benefits of the procedure                     and the sedation options and risks were discussed with the                     patient. All questions were answered, and informed consent                     was obtained. Prior Anticoagulants: The patient has taken                     no previous anticoagulant or antiplatelet agents. ASA                     Grade Assessment: II - A patient with mild systemic                     disease. After reviewing the risks and benefits, the                     patient was deemed in satisfactory condition to undergo                     the procedure.                    After obtaining informed consent, the colonoscope was                     passed under direct vision. Throughout the procedure, the                     patient's blood pressure, pulse, and oxygen saturations                     were monitored continuously. The Olympus CF H180AL                     colonoscope (S#: [id number]) was introduced through the anus  and advanced to the the cecum, identified by appendiceal                   orifice and ileocecal valve. The colonoscopy was performed                     without difficulty. The patient tolerated the procedure                     well. The quality of the bowel preparation was excellent. Findings:      The perianal and digital rectal examinations were normal.      Non-bleeding internal hemorrhoids were found during retroflexion. The       hemorrhoids were Grade II (internal hemorrhoids that prolapse but reduce       spontaneously). Impression:        - Non-bleeding internal hemorrhoids.                    - No specimens collected. Recommendation:    - Repeat colonoscopy in 10 years for screening unless any                     change in family history or lower GI problems. Procedure Code(s): --- Professional ---                    45378, Colonoscopy, [id number] [phone]; diagnostic, including                     collection of specimen(s) by brushing or washing, when                     performed (separate procedure) Diagnosis Code(s): --- Professional ---                    Z12.11, Encounter for screening for malignant neoplasm of                     colon CPT copyright 2014 American Medical Association. All rights reserved. The codes documented in this report are preliminary and upon coder review may  be revised to meet current compliance requirements. Midge Miniumarren Tuwanda Vokes, MD 08/05/2015 9:56:33 AM This report has been signed electronically. Number of Addenda: 0 Note Initiated On: 08/05/2015 9:40 AM Scope Withdrawal Time: 0 hours 6 minutes 17 seconds  Total Procedure Duration: 0 hours 12 minutes 39 seconds       [facility]

## 2015-08-05 NOTE — Anesthesia Procedure Notes (Signed)
Procedure Name: MAC Performed by: Moe Brier Pre-anesthesia Checklist: Patient identified, Emergency Drugs available, Suction available, Timeout performed and Patient being monitored Patient Re-evaluated:Patient Re-evaluated prior to inductionOxygen Delivery Method: Nasal cannula Placement Confirmation: positive ETCO2     

## 2015-08-08 ENCOUNTER — Encounter: Payer: Self-pay | Admitting: Gastroenterology

## 2015-08-08 ENCOUNTER — Telehealth: Payer: Self-pay | Admitting: Family Medicine

## 2015-08-08 NOTE — Telephone Encounter (Signed)
Pt states she came in a few weeks ago and states she was diagnosed with shingles. Pt states she is currently on Acyclovir, ointment. Pt states this is causing her to burn and itch. Please advise.

## 2015-08-09 ENCOUNTER — Ambulatory Visit (INDEPENDENT_AMBULATORY_CARE_PROVIDER_SITE_OTHER): Payer: BLUE CROSS/BLUE SHIELD | Admitting: Family Medicine

## 2015-08-09 ENCOUNTER — Encounter: Payer: Self-pay | Admitting: Family Medicine

## 2015-08-09 VITALS — BP 118/64 | HR 94 | Temp 98.2°F | Resp 18 | Ht 63.0 in | Wt 189.0 lb

## 2015-08-09 DIAGNOSIS — B029 Zoster without complications: Secondary | ICD-10-CM | POA: Insufficient documentation

## 2015-08-09 DIAGNOSIS — B028 Zoster with other complications: Secondary | ICD-10-CM | POA: Diagnosis not present

## 2015-08-09 MED ORDER — GABAPENTIN 100 MG PO CAPS: 100.0000 mg | ORAL_CAPSULE | Freq: Three times a day (TID) | ORAL | Status: DC

## 2015-08-09 NOTE — Progress Notes (Signed)
Name: Kathy Benjamin   MRN: [id number]    DOB: 10-Jul-1956   Date:08/09/2015       Progress Note  Subjective  Chief Complaint  Chief Complaint  Patient presents with  . Acute Visit    Shingles meds burning (Dr. Carlynn Purl pt)  . Herpes Zoster    Still having burning, itching and right neck soreness. Patient finished her Valtrex and still has the rashs. Dr. Carlynn Purl prescribed gave her cream Zovirax but it made her shingles spots burn.    HPI  Pt. Follows up for an episode of shingles diagnosed three weeks ago. She was treated with Valtrex 1000 mg three times daily and Hydrocodone 10-325 mg every 6 hours as needed for pain relief. She reports the rash has subsided to some extent, pain is still there (itching and burning especially after she applied Acyclovir ointment to her neck).  Reports Hydrocodone 10 mg 'knocks me out.'    Past Medical History  Diagnosis Date  . Insomnia   . Menopausal and perimenopausal disorder   . Obesity   . Allergy   . Pre-diabetes   . Vitamin A deficiency   . Osteoporosis   . Tendonitis   . Edema   . Shingles 07/14/15    right ear and neck.  Completed valtrex. scabbing over  . Environmental allergies     Past Surgical History  Procedure Laterality Date  . Abdominal hysterectomy    . Breast surgery Right [id number]    biopsy  . Hernia repair      umbilical  . Breast biopsy Right 2009    neg  . Colonoscopy    . Colonoscopy with propofol N/A 08/05/2015    Procedure: COLONOSCOPY WITH PROPOFOL;  Surgeon: Midge Minium, MD;  [facility];  Service: Endoscopy;  Laterality: N/A;    Family History  Problem Relation Age of Onset  . Diabetes Mother   . Hypertension Mother   . Stroke Father   . Hypertension Father   . Cancer Father   . Hyperthyroidism Sister   . Cancer Brother     prostate  . Hyperthyroidism Brother     Social History   Social History  . Marital Status: Single    Spouse Name: N/A  . Number of Children: N/A    . Years of Education: N/A   Occupational History  . Not on file.   Social History Main Topics  . Smoking status: Never Smoker   . Smokeless tobacco: Never Used  . Alcohol Use: No  . Drug Use: No  . Sexual Activity: Not Currently   Other Topics Concern  . Not on file   Social History Narrative     Current outpatient prescriptions:  .  acyclovir ointment (ZOVIRAX) 5 %, Apply 1 application topically every 4 (four) hours., Disp: 60 g, Rfl: 0 .  Biotin (BIOTIN 5000) 5 MG CAPS, Take 1 tablet by mouth daily., Disp: , Rfl:  .  BOSWELLIA-GLUCOSAMINE-VIT D PO, Take 1 tablet by mouth daily., Disp: , Rfl:  .  Calcium Carbonate-Vitamin D (CALCIUM 600-D) 600-400 MG-UNIT per tablet, Take 1 tablet by mouth daily., Disp: , Rfl:  .  fluticasone (FLONASE) 50 MCG/ACT nasal spray, Place 2 sprays into both nostrils daily., Disp: , Rfl: 5 .  HYDROcodone-acetaminophen (NORCO) 10-325 MG tablet, Take 1 tablet by mouth every 6 (six) hours as needed., Disp: 20 tablet, Rfl: 0 .  ibuprofen (ADVIL,MOTRIN) 800 MG tablet, Take 1 tablet by mouth as needed., Disp: , Rfl:  .  loratadine (CLARITIN) 10 MG tablet, Take 1 tablet by mouth as needed., Disp: , Rfl:  .  montelukast (SINGULAIR) 10 MG tablet, Take 1 tablet by mouth as needed., Disp: , Rfl:   No Known Allergies   Review of Systems  Constitutional: Negative for fever and chills.  Skin: Positive for itching and rash.     Objective  Filed Vitals:   08/09/15 1504  BP: 118/64  Pulse: 94  Temp: 98.2 F (36.8 C)  TempSrc: Oral  Resp: 18  Height: 5\' 3"  (1.6 m)  Weight: 189 lb (85.73 kg)  SpO2: 96%    Physical Exam  Constitutional: She is well-developed, well-nourished, and in no distress.  Skin: Skin is warm and dry. Rash noted. Rash is maculopapular. There is erythema.     Erythematous, macular rash with interspersed healing blisters on right side of neck and around the right ear with erythema on the right ear lobe.  Nursing note and vitals  reviewed.    Assessment & Plan  1. Herpes zoster of neck Patient adequately treated with Valtrex and hydrocodone. Symptoms now resolving but itching at the affected area is still present. DC acyclovir ointment and start on low gabapentin 100 mg 3 times daily. Patient advised to take one half of hydrocodone 10 mg twice a day as needed for pain relief. Follow-up with PCP in 1 week. - gabapentin (NEURONTIN) 100 MG capsule; Take 1 capsule (100 mg total) by mouth 3 (three) times daily.  Dispense: 30 capsule; Refill: 0   Shonette Rhames Asad A. Faylene KurtzShah [facility] 08/09/2015 3:21 PM

## 2015-08-10 NOTE — Telephone Encounter (Signed)
She has post-herpetic neuralgia and needs to be seen

## 2015-08-10 NOTE — Telephone Encounter (Signed)
Patient was seen yesterday by Dr. Sherryll BurgerShah.

## 2015-08-15 ENCOUNTER — Ambulatory Visit (INDEPENDENT_AMBULATORY_CARE_PROVIDER_SITE_OTHER): Payer: BLUE CROSS/BLUE SHIELD | Admitting: Family Medicine

## 2015-08-15 ENCOUNTER — Encounter: Payer: Self-pay | Admitting: Family Medicine

## 2015-08-15 DIAGNOSIS — B0229 Other postherpetic nervous system involvement: Secondary | ICD-10-CM | POA: Diagnosis not present

## 2015-08-15 MED ORDER — LIDOCAINE 5 % EX PTCH: 1.0000 | MEDICATED_PATCH | CUTANEOUS | Status: DC

## 2015-08-15 MED ORDER — PREDNISONE 5 MG (48) PO TBPK: 5.0000 mg | ORAL_TABLET | Freq: Every day | ORAL | Status: DC

## 2015-08-15 MED ORDER — GABAPENTIN 100 MG PO CAPS: 100.0000 mg | ORAL_CAPSULE | Freq: Three times a day (TID) | ORAL | Status: DC

## 2015-08-15 NOTE — Progress Notes (Signed)
Name: Kathy Benjamin   MRN: [id number]    DOB: 25-Dec-1955   Date:08/15/2015       Progress Note  Subjective  Chief Complaint  Chief Complaint  Patient presents with  . Follow-up    1 week  . Herpes Zoster    healing well still having some itching and burning    HPI  Post- herpetic neuralgia: she had a herpes outbreak a few weeks ago. Rash has cleared, however continues to have burning and itching sensation on the area of outbreak. She is back to work. Seen by Dr. Sherryll BurgerShah and was given Gabapentin, but she is only taking 100 mg three times daily. She states that it has not improved symptoms much.  Cream caused skin irritation.   Patient Active Problem List   Diagnosis Date Noted  . Post herpetic neuralgia 08/15/2015  . Special screening for malignant neoplasms, colon   . Bunion of left foot 05/02/2015  . H/O: hysterectomy 05/02/2015  . Insomnia 04/29/2015  . Edema leg 04/29/2015  . Obesity (BMI 30.0-34.9) 04/29/2015  . Primary localized osteoarthrosis, lower leg 04/29/2015  . Perennial allergic rhinitis with seasonal variation 04/29/2015  . Borderline diabetes 04/29/2015  . Menopausal symptom 04/29/2015  . Vitamin D deficiency 04/29/2015  . Abnormal electrocardiogram 04/02/2007    Past Surgical History  Procedure Laterality Date  . Abdominal hysterectomy    . Breast surgery Right [id number]    biopsy  . Hernia repair      umbilical  . Breast biopsy Right 2009    neg  . Colonoscopy    . Colonoscopy with propofol N/A 08/05/2015    Procedure: COLONOSCOPY WITH PROPOFOL;  Surgeon: Midge Miniumarren Wohl, MD;  [facility];  Service: Endoscopy;  Laterality: N/A;    Family History  Problem Relation Age of Onset  . Diabetes Mother   . Hypertension Mother   . Stroke Father   . Hypertension Father   . Cancer Father   . Hyperthyroidism Sister   . Cancer Brother     prostate  . Hyperthyroidism Brother     Social History   Social History  . Marital Status:  Single    Spouse Name: N/A  . Number of Children: N/A  . Years of Education: N/A   Occupational History  . Not on file.   Social History Main Topics  . Smoking status: Never Smoker   . Smokeless tobacco: Never Used  . Alcohol Use: No  . Drug Use: No  . Sexual Activity: Not Currently   Other Topics Concern  . Not on file   Social History Narrative     Current outpatient prescriptions:  .  acyclovir ointment (ZOVIRAX) 5 %, Apply 1 application topically every 4 (four) hours., Disp: 60 g, Rfl: 0 .  Biotin (BIOTIN 5000) 5 MG CAPS, Take 1 tablet by mouth daily., Disp: , Rfl:  .  BOSWELLIA-GLUCOSAMINE-VIT D PO, Take 1 tablet by mouth daily., Disp: , Rfl:  .  Calcium Carbonate-Vitamin D (CALCIUM 600-D) 600-400 MG-UNIT per tablet, Take 1 tablet by mouth daily., Disp: , Rfl:  .  fluticasone (FLONASE) 50 MCG/ACT nasal spray, Place 2 sprays into both nostrils daily., Disp: , Rfl: 5 .  gabapentin (NEURONTIN) 100 MG capsule, Take 1-3 capsules (100-300 mg total) by mouth 3 (three) times daily. 1 in am, 1 in pm and 3 qhs, Disp: 150 capsule, Rfl: 0 .  HYDROcodone-acetaminophen (NORCO) 10-325 MG tablet, Take 1 tablet by mouth every 6 (six) hours as needed.,  Disp: 20 tablet, Rfl: 0 .  ibuprofen (ADVIL,MOTRIN) 800 MG tablet, Take 1 tablet by mouth as needed., Disp: , Rfl:  .  loratadine (CLARITIN) 10 MG tablet, Take 1 tablet by mouth as needed., Disp: , Rfl:  .  montelukast (SINGULAIR) 10 MG tablet, Take 1 tablet by mouth as needed., Disp: , Rfl:  .  predniSONE (STERAPRED UNI-PAK 48 TAB) 5 MG (48) TBPK tablet, Take 1 tablet (5 mg total) by mouth daily., Disp: 48 tablet, Rfl: 0  No Known Allergies   ROS  Ten systems reviewed and is negative except as mentioned in HPI   Objective  Filed Vitals:   08/15/15 1032  BP: 122/74  Pulse: 90  Temp: 98.5 F (36.9 C)  TempSrc: Oral  Resp: 14  Weight: 194 lb 11.2 oz (88.315 kg)  SpO2: 98%    Body mass index is 34.5 kg/(m^2).  Physical  Exam  Constitutional: Patient appears well-developed and well-nourished. Obese No distress.  HEENT: head atraumatic, normocephalic Cardiovascular: Normal rate, regular rhythm and normal heart sounds.  No murmur heard. Non-pitting edema Pulmonary/Chest: Effort normal and breath sounds normal. No respiratory distress. Psychiatric: Patient has a normal mood and affect. behavior is normal. Judgment and thought content normal. Skin: hyperpigmentation on right side of neck and nuchal area, from recent zoster outbreak   PHQ2/9: Depression screen [facility] 2/9 07/20/2015 05/02/2015  Decreased Interest 0 0  Down, Depressed, Hopeless 0 0  PHQ - 2 Score 0 0     Fall Risk: Fall Risk  07/20/2015 05/02/2015  Falls in the past year? No No     Assessment & Plan  1. Post herpetic neuralgia  Discussed trying off label use of prednisone, and increase dose of Gabapentin. Discussed side effects of medication  - predniSONE (STERAPRED UNI-PAK 48 TAB) 5 MG (48) TBPK tablet; Take 1 tablet (5 mg total) by mouth daily.  Dispense: 48 tablet; Refill: 0 - gabapentin (NEURONTIN) 100 MG capsule; Take 1-3 capsules (100-300 mg total) by mouth 3 (three) times daily. 1 in am, 1 in pm and 3 qhs  Dispense: 150 capsule; Refill: 0 - lidocaine (LIDODERM) 5 %; Place 1 patch onto the skin daily. Remove & Discard patch within 12 hours or as directed by MD  Dispense: 30 patch; Refill: 0

## 2015-08-15 NOTE — Patient Instructions (Signed)
Postherpetic Neuralgia   Postherpetic neuralgia (PHN) is nerve pain that occurs after a shingles infection. Shingles is a painful rash that appears on one side of the body, usually on your trunk or face. Shingles is caused by the varicella-zoster virus. This is the same virus that causes chickenpox. In people who have had chickenpox, the virus can resurface years later and cause shingles.   You may have PHN if you continue to have pain for 3 months after your shingles rash has gone away. PHN appears in the same area where you had the shingles rash. For most people, PHN goes away within 1 year.   Getting a vaccination for shingles can prevent PHN. This vaccine is recommended for people older than 50. It may prevent shingles and may also lower your risk of PHN if you do get shingles.   CAUSES   PHN is caused by damage to your nerves from the varicella-zoster virus. This damage makes your nerves overly sensitive.   RISK FACTORS   Aging is the biggest risk factor for developing PHN. Most people who get PHN are older than 60. Other risk factors include:   Having very bad pain before your shingles rash starts.   Having a very bad rash.   Having shingles in the nerve that supplies your face and eye (trigeminal nerve).  SIGNS AND SYMPTOMS   Pain is the main symptom of PHN. The pain is often very bad and may be described as stabbing, burning, or feeling like an electric shock. The pain may come and go or may be there all the time. Pain may be triggered by light touches on the skin or changes in temperature. You may have itching along with the pain.   DIAGNOSIS   Your health care provider may diagnose PHN based on your symptoms and your history of shingles. Lab studies and other diagnostic tests are usually not needed.   TREATMENT   There is no cure for PHN. Treatment for PHN will focus on pain relief. Over-the-counter pain relievers do not usually relieve PHN pain. You may need to work with a pain specialist. Treatment may  include:   Antidepressant medicines to help with pain and improve sleep.   Antiseizure medicines to relieve nerve pain.   Strong pain relievers (opioids).   A numbing patch worn on the skin (lidocaine patch).  HOME CARE INSTRUCTIONS   It may take a long time to recover from PHN. Work closely with your health care provider, and have a good support system at home.   Take all medicines as directed by your health care provider.   Wear loose, comfortable clothing.   Cover sensitive areas with a dressing to reduce friction from clothing rubbing on the area.   If cold does not make your pain worse, try applying a cool compress or cooling gel pack to the area.   Talk to your health care provider if you feel depressed or desperate. Living with long-term pain can be depressing.  SEEK MEDICAL CARE IF:   Your medicine is not helping.   You are struggling to manage your pain at home.  This information is not intended to replace advice given to you by your health care provider. Make sure you discuss any questions you have with your health care provider.   Document Released: 11/24/2002 Document Revised: 09/24/2014 Document Reviewed: 08/25/2013   Elsevier Interactive Patient Education 2016 [employer]

## 2015-09-10 ENCOUNTER — Other Ambulatory Visit: Payer: Self-pay | Admitting: Family Medicine

## 2015-09-27 ENCOUNTER — Telehealth: Payer: Self-pay | Admitting: Family Medicine

## 2015-09-27 NOTE — Telephone Encounter (Signed)
Patient would like something called in for her cough and congestion preferable an inhaler.  She uses the [facility] [address].  Patient has been using Muxicex since 09/26/15.

## 2015-09-27 NOTE — Telephone Encounter (Signed)
I am sorry, but she will need follow up

## 2015-09-28 ENCOUNTER — Ambulatory Visit (INDEPENDENT_AMBULATORY_CARE_PROVIDER_SITE_OTHER): Payer: BLUE CROSS/BLUE SHIELD | Admitting: Family Medicine

## 2015-09-28 ENCOUNTER — Encounter: Payer: Self-pay | Admitting: Family Medicine

## 2015-09-28 VITALS — BP 118/68 | HR 86 | Temp 98.8°F | Resp 18 | Ht 63.0 in | Wt 196.0 lb

## 2015-09-28 DIAGNOSIS — J329 Chronic sinusitis, unspecified: Secondary | ICD-10-CM | POA: Diagnosis not present

## 2015-09-28 DIAGNOSIS — J40 Bronchitis, not specified as acute or chronic: Secondary | ICD-10-CM

## 2015-09-28 DIAGNOSIS — R062 Wheezing: Secondary | ICD-10-CM | POA: Diagnosis not present

## 2015-09-28 MED ORDER — PREDNISONE 20 MG PO TABS: 20.0000 mg | ORAL_TABLET | Freq: Every day | ORAL | Status: DC

## 2015-09-28 MED ORDER — ALBUTEROL SULFATE HFA 108 (90 BASE) MCG/ACT IN AERS: 2.0000 | INHALATION_SPRAY | Freq: Four times a day (QID) | RESPIRATORY_TRACT | Status: DC | PRN

## 2015-09-28 MED ORDER — BENZONATATE 100 MG PO CAPS: 100.0000 mg | ORAL_CAPSULE | Freq: Two times a day (BID) | ORAL | Status: DC | PRN

## 2015-09-28 MED ORDER — AMOXICILLIN-POT CLAVULANATE 875-125 MG PO TABS: 1.0000 | ORAL_TABLET | Freq: Two times a day (BID) | ORAL | Status: DC

## 2015-09-28 NOTE — Progress Notes (Signed)
Name: Kathy Benjamin   MRN: [id number]    DOB: 1956-07-09   Date:09/28/2015       Progress Note  Subjective  Chief Complaint  Chief Complaint  Patient presents with  . Cough    productive cough 3-4 days    HPI  Acute URI  Patient presents with a 3 or 4 day history of productive cough. There has been no significant fever or chills. There is no history of COPD or asthma.  Past Medical History  Diagnosis Date  . Insomnia   . Menopausal and perimenopausal disorder   . Obesity   . Allergy   . Pre-diabetes   . Vitamin A deficiency   . Osteoporosis   . Tendonitis   . Edema   . Shingles 07/14/15    right ear and neck.  Completed valtrex. scabbing over  . Environmental allergies     Social History  Substance Use Topics  . Smoking status: Never Smoker   . Smokeless tobacco: Never Used  . Alcohol Use: No     Current outpatient prescriptions:  .  acyclovir ointment (ZOVIRAX) 5 %, Apply 1 application topically every 4 (four) hours., Disp: 60 g, Rfl: 0 .  Biotin (BIOTIN 5000) 5 MG CAPS, Take 1 tablet by mouth daily., Disp: , Rfl:  .  BOSWELLIA-GLUCOSAMINE-VIT D PO, Take 1 tablet by mouth daily., Disp: , Rfl:  .  Calcium Carbonate-Vitamin D (CALCIUM 600-D) 600-400 MG-UNIT per tablet, Take 1 tablet by mouth daily., Disp: , Rfl:  .  fluticasone (FLONASE) 50 MCG/ACT nasal spray, Place 2 sprays into both nostrils daily., Disp: , Rfl: 5 .  gabapentin (NEURONTIN) 100 MG capsule, TAKE 1 CAPSULE IN THE AM,1 CAPSULE IN PM AND 3 CAPSULES AT BEDTIME, Disp: 150 capsule, Rfl: 0 .  HYDROcodone-acetaminophen (NORCO) 10-325 MG tablet, Take 1 tablet by mouth every 6 (six) hours as needed., Disp: 20 tablet, Rfl: 0 .  ibuprofen (ADVIL,MOTRIN) 800 MG tablet, Take 1 tablet by mouth as needed., Disp: , Rfl:  .  lidocaine (LIDODERM) 5 %, Place 1 patch onto the skin daily. Remove & Discard patch within 12 hours or as directed by MD, Disp: 30 patch, Rfl: 0 .  loratadine (CLARITIN) 10 MG tablet, Take  1 tablet by mouth as needed., Disp: , Rfl:  .  montelukast (SINGULAIR) 10 MG tablet, Take 1 tablet by mouth as needed., Disp: , Rfl:  .  predniSONE (STERAPRED UNI-PAK 48 TAB) 5 MG (48) TBPK tablet, Take 1 tablet (5 mg total) by mouth daily., Disp: 48 tablet, Rfl: 0  No Known Allergies  Review of Systems  Constitutional: Negative for fever, chills and weight loss.  HENT: Positive for congestion. Negative for hearing loss, sore throat and tinnitus.   Eyes: Negative for blurred vision, double vision and redness.  Respiratory: Positive for cough and sputum production. Negative for hemoptysis and shortness of breath.   Cardiovascular: Negative for chest pain, palpitations, orthopnea, claudication and leg swelling.  Gastrointestinal: Negative for heartburn, nausea, vomiting, diarrhea, constipation and blood in stool.  Genitourinary: Negative for dysuria, urgency, frequency and hematuria.  Musculoskeletal: Negative for myalgias, back pain, joint pain, falls and neck pain.  Skin: Negative for itching.  Neurological: Negative for dizziness, tingling, tremors, focal weakness, seizures, loss of consciousness, weakness and headaches.  Endo/Heme/Allergies: Does not bruise/bleed easily.  Psychiatric/Behavioral: Negative for depression and substance abuse. The patient is not nervous/anxious and does not have insomnia.      Objective  Filed Vitals:   09/28/15  1041  BP: 118/68  Pulse: 86  Temp: 98.8 F (37.1 C)  Resp: 18  Height: 5\' 3"  (1.6 m)  Weight: 196 lb (88.905 kg)  SpO2: 96%     Physical Exam  Constitutional: She is oriented to person, place, and time and well-developed, well-nourished, and in no distress.  HENT:  Head: Normocephalic.  Eyes: EOM are normal. Pupils are equal, round, and reactive to light.  Neck: Normal range of motion. No thyromegaly present.  Cardiovascular: Normal rate, regular rhythm and normal heart sounds.   No murmur heard. Pulmonary/Chest: Effort normal and  breath sounds normal.  Abdominal: Soft. Bowel sounds are normal.  Musculoskeletal: Normal range of motion. She exhibits no edema.  Neurological: She is alert and oriented to person, place, and time. No cranial nerve deficit. Gait normal.  Skin: Skin is warm and dry. No rash noted.  Psychiatric: Memory and affect normal.      Assessment & Plan  1. Bronchitis Acute and moderate in severity - benzonatate (TESSALON) 100 MG capsule; Take 1 capsule (100 mg total) by mouth 2 (two) times daily as needed for cough.  Dispense: 30 capsule; Refill: 0 - predniSONE (DELTASONE) 20 MG tablet; Take 1 tablet (20 mg total) by mouth daily with breakfast.  Dispense: 10 tablet; Refill: 0 - amoxicillin-clavulanate (AUGMENTIN) 875-125 MG tablet; Take 1 tablet by mouth 2 (two) times daily.  Dispense: 10 tablet; Refill: 0  2. Wheezing Bronchodilator - albuterol (PROVENTIL HFA;VENTOLIN HFA) 108 (90 Base) MCG/ACT inhaler; Inhale 2 puffs into the lungs every 6 (six) hours as needed for wheezing or shortness of breath.  Dispense: 1 Inhaler; Refill: 0  3. Sinusitis, unspecified chronicity, unspecified location Course of steroids with insufflation - predniSONE (DELTASONE) 20 MG tablet; Take 1 tablet (20 mg total) by mouth daily with breakfast.  Dispense: 10 tablet; Refill: 0 - amoxicillin-clavulanate (AUGMENTIN) 875-125 MG tablet; Take 1 tablet by mouth 2 (two) times daily.  Dispense: 10 tablet; Refill: 0

## 2015-10-19 ENCOUNTER — Other Ambulatory Visit: Payer: Self-pay | Admitting: Family Medicine

## 2016-05-02 ENCOUNTER — Encounter: Payer: Self-pay | Admitting: Family Medicine

## 2016-05-02 ENCOUNTER — Ambulatory Visit (INDEPENDENT_AMBULATORY_CARE_PROVIDER_SITE_OTHER): Payer: BLUE CROSS/BLUE SHIELD | Admitting: Family Medicine

## 2016-05-02 VITALS — BP 118/70 | HR 60 | Temp 97.9°F | Resp 18 | Ht 63.0 in | Wt 188.6 lb

## 2016-05-02 DIAGNOSIS — Z0000 Encounter for general adult medical examination without abnormal findings: Secondary | ICD-10-CM | POA: Diagnosis not present

## 2016-05-02 DIAGNOSIS — N63 Unspecified lump in breast: Secondary | ICD-10-CM | POA: Diagnosis not present

## 2016-05-02 DIAGNOSIS — M2042 Other hammer toe(s) (acquired), left foot: Secondary | ICD-10-CM

## 2016-05-02 DIAGNOSIS — L84 Corns and callosities: Secondary | ICD-10-CM | POA: Diagnosis not present

## 2016-05-02 DIAGNOSIS — R7303 Prediabetes: Secondary | ICD-10-CM | POA: Diagnosis not present

## 2016-05-02 DIAGNOSIS — E559 Vitamin D deficiency, unspecified: Secondary | ICD-10-CM | POA: Diagnosis not present

## 2016-05-02 DIAGNOSIS — Z1239 Encounter for other screening for malignant neoplasm of breast: Secondary | ICD-10-CM | POA: Diagnosis not present

## 2016-05-02 DIAGNOSIS — Z13220 Encounter for screening for lipoid disorders: Secondary | ICD-10-CM | POA: Diagnosis not present

## 2016-05-02 DIAGNOSIS — N6311 Unspecified lump in the right breast, upper outer quadrant: Secondary | ICD-10-CM

## 2016-05-02 DIAGNOSIS — Z01419 Encounter for gynecological examination (general) (routine) without abnormal findings: Secondary | ICD-10-CM

## 2016-05-02 LAB — COMPLETE METABOLIC PANEL WITH GFR
ALT: 9 U/L (ref 6–29)
AST: 16 U/L (ref 10–35)
Albumin: 4 g/dL (ref 3.6–5.1)
Alkaline Phosphatase: 32 U/L — ABNORMAL LOW (ref 33–130)
BUN: 13 mg/dL (ref 7–25)
CHLORIDE: 103 mmol/L (ref 98–110)
CO2: 31 mmol/L (ref 20–31)
CREATININE: 0.68 mg/dL (ref 0.50–1.05)
Calcium: 9.1 mg/dL (ref 8.6–10.4)
GFR, Est African American: >89 mL/min (ref >=60)
GFR, Est Non African American: >89 mL/min (ref >=60)
Glucose, Bld: 76 mg/dL (ref 65–99)
Potassium: 3.8 mmol/L (ref 3.5–5.3)
SODIUM: 140 mmol/L (ref 135–146)
Total Bilirubin: 1.2 mg/dL (ref 0.2–1.2)
Total Protein: 6.4 g/dL (ref 6.1–8.1)

## 2016-05-02 LAB — LIPID PANEL
CHOLESTEROL: 167 mg/dL (ref 125–200)
HDL: 65 mg/dL (ref >=46)
LDL Cholesterol: 91 mg/dL (ref <130)
Total CHOL/HDL Ratio: 2.6 Ratio (ref <=5.0)
Triglycerides: 54 mg/dL (ref <150)
VLDL: 11 mg/dL (ref <30)

## 2016-05-02 NOTE — Addendum Note (Signed)
Addended by: Phineas SemenJOHNSON, Kodi Guerrera L on: 05/02/2016 09:00 AM   Modules accepted: Orders

## 2016-05-02 NOTE — Progress Notes (Signed)
Name: Roxan HockeyDorothy Ann Santy   MRN: [id number]    DOB: 1956-07-22   Date:05/02/2016       Progress Note  Subjective  Chief Complaint  Chief Complaint  Patient presents with  . Annual Exam    HPI  Well Woman: she has noticed increase in urinary frequency over the past couple of months, but still more than two hours apart - she has urgency. No dysuria, no hematuria. No vaginal discharge, no sexually active for many years, no breast problems. She continues to swell on her legs and has noticed a corn on top of 2nd left toe. States very seldom has pain on the right side of neck from post-herpetic neuralgia.    Patient Active Problem List   Diagnosis Date Noted  . Post herpetic neuralgia 08/15/2015  . Bunion of left foot 05/02/2015  . H/O: hysterectomy 05/02/2015  . Edema leg 04/29/2015  . Obesity (BMI 30.0-34.9) 04/29/2015  . Primary localized osteoarthrosis, lower leg 04/29/2015  . Perennial allergic rhinitis with seasonal variation 04/29/2015  . Borderline diabetes 04/29/2015  . Menopausal symptom 04/29/2015  . Vitamin D deficiency 04/29/2015  . Abnormal electrocardiogram 04/02/2007    Past Surgical History:  Procedure Laterality Date  . ABDOMINAL HYSTERECTOMY    . BREAST BIOPSY Right 2009   neg  . BREAST SURGERY Right [id number]   biopsy  . COLONOSCOPY    . COLONOSCOPY WITH PROPOFOL N/A 08/05/2015   Procedure: COLONOSCOPY WITH PROPOFOL;  Surgeon: Midge Miniumarren Wohl, MD;  [facility];  Service: Endoscopy;  Laterality: N/A;  . HERNIA REPAIR     umbilical    Family History  Problem Relation Age of Onset  . Diabetes Mother   . Hypertension Mother   . Stroke Father   . Hypertension Father   . Cancer Father   . Hyperthyroidism Sister   . Cancer Brother     prostate  . Hyperthyroidism Brother     Social History   Social History  . Marital status: Single    Spouse name: N/A  . Number of children: N/A  . Years of education: N/A   Occupational History  .  Not on file.   Social History Main Topics  . Smoking status: Never Smoker  . Smokeless tobacco: Never Used  . Alcohol use No  . Drug use: No  . Sexual activity: Not Currently   Other Topics Concern  . Not on file   Social History Narrative  . No narrative on file     Current Outpatient Prescriptions:  .  Biotin 9562110000 MCG TABS, Take 1 tablet by mouth at bedtime., Disp: , Rfl:  .  Cholecalciferol (VITAMIN D) 2000 units CAPS, Take 1 capsule by mouth daily., Disp: , Rfl:  .  Calcium Carbonate-Vitamin D (CALCIUM 600-D) 600-400 MG-UNIT per tablet, Take 1 tablet by mouth daily., Disp: , Rfl:  .  fluticasone (FLONASE) 50 MCG/ACT nasal spray, Place 2 sprays into both nostrils daily., Disp: , Rfl: 5 .  gabapentin (NEURONTIN) 100 MG capsule, TAKE 1 CAPSULE IN THE AM,1 CAPSULE IN PM AND 3 CAPSULES AT BEDTIME, Disp: 150 capsule, Rfl: 0 .  loratadine (CLARITIN) 10 MG tablet, Take 1 tablet by mouth as needed., Disp: , Rfl:   No Known Allergies   ROS  Constitutional: Negative for fever, positive for mild  weight change.  Respiratory: Negative for cough and shortness of breath.   Cardiovascular: Negative for chest pain or palpitations.  Gastrointestinal: Negative for abdominal pain, no bowel changes.  Musculoskeletal: Negative for gait problem or joint swelling.  Skin: Negative for rash.  Neurological: Negative for dizziness or headache.  No other specific complaints in a complete review of systems (except as listed in HPI above).  Objective  Vitals:   05/02/16 0824  BP: 118/70  Pulse: 60  Resp: 18  Temp: 97.9 F (36.6 C)  SpO2: 98%  Weight: 188 lb 9 oz (85.5 kg)  Height: 5\' 3"  (1.6 m)    Body mass index is 33.4 kg/m.  Physical Exam  Constitutional: Patient appears well-developed and obese No distress.  HENT: Head: Normocephalic and atraumatic. Ears: B TMs ok, no erythema or effusion; Nose: Nose normal. Mouth/Throat: Oropharynx is clear and moist. No oropharyngeal exudate.   Eyes: Conjunctivae and EOM are normal. Pupils are equal, round, and reactive to light. No scleral icterus.  Neck: Normal range of motion. Neck supple. No JVD present. No thyromegaly present.  Cardiovascular: Normal rate, regular rhythm and normal heart sounds.  No murmur heard. No BLE edema. Pulmonary/Chest: Effort normal and breath sounds normal. No respiratory distress. Abdominal: Soft. Bowel sounds are normal, no distension. There is no tenderness. no masses Breast: previous lumpectomy on right breast, she still has a mass above the scar, we will order diagnostic mammogram FEMALE GENITALIA:  External genitalia normal External urethra normal Pelvic not done RECTAL: not done  Musculoskeletal: Normal range of motion, no joint effusions. No gross deformities Neurological: he is alert and oriented to person, place, and time. No cranial nerve deficit. Coordination, balance, strength, speech and gait are normal.  Skin: Skin is warm and dry. Corn formation on top on 2nd left toe. No erythema.  Psychiatric: Patient has a normal mood and affect. behavior is normal. Judgment and thought content normal.  PHQ2/9: Depression screen [facility] 2/9 05/02/2016 09/28/2015 07/20/2015 05/02/2015  Decreased Interest 0 0 0 0  Down, Depressed, Hopeless 0 0 0 0  PHQ - 2 Score 0 0 0 0    Fall Risk: Fall Risk  09/28/2015 07/20/2015 05/02/2015  Falls in the past year? No No No    Functional Status Survey: Is the patient deaf or have difficulty hearing?: No Does the patient have difficulty seeing, even when wearing glasses/contacts?: No Does the patient have difficulty concentrating, remembering, or making decisions?: No Does the patient have difficulty walking or climbing stairs?: No Does the patient have difficulty dressing or bathing?: No Does the patient have difficulty doing errands alone such as visiting a doctor's office or shopping?: No    Assessment & Plan  1. Well woman exam  Discussed importance of  150 minutes of physical activity weekly, eat two servings of fish weekly, eat one serving of tree nuts ( cashews, pistachios, pecans, almonds.[name].) every other day, eat 6 servings of fruit/vegetables daily and drink plenty of water and avoid sweet beverages.  - COMPLETE METABOLIC PANEL WITH GFR  2. Vitamin D deficiency  - VITAMIN D 25 Hydroxy (Vit-D Deficiency, Fractures)  3. Lipid screening  - Lipid panel  4. Breast cancer screening  - MM Digital Screening; Future  5. Borderline diabetes  - Hemoglobin A1c  6. Breast lump on right side at 11 o'clock position  - MM Digital Diagnostic Bilat; Future - US BREAST LTD UNI RIGHT INC AXILLA; Future  7. Acquired hammer toe, left  She will call back when she is ready for referral to Podiatrist  8. Corn of toe  See above

## 2016-05-03 LAB — VITAMIN D 25 HYDROXY (VIT D DEFICIENCY, FRACTURES): Vit D, 25-Hydroxy: 28 ng/mL — ABNORMAL LOW (ref 30–100)

## 2016-05-03 LAB — HEMOGLOBIN A1C
HEMOGLOBIN A1C: 5.5 % (ref <5.7)
MEAN PLASMA GLUCOSE: 111 mg/dL

## 2016-06-14 ENCOUNTER — Other Ambulatory Visit: Payer: BLUE CROSS/BLUE SHIELD

## 2016-06-14 ENCOUNTER — Ambulatory Visit: Payer: BLUE CROSS/BLUE SHIELD

## 2016-06-15 ENCOUNTER — Ambulatory Visit
Admission: RE | Admit: 2016-06-15 | Discharge: 2016-06-15 | Disposition: A | Discharge status: Home / Self Care | Payer: BLUE CROSS/BLUE SHIELD | Source: Ambulatory Visit | Attending: Family Medicine | Admitting: Family Medicine

## 2016-06-15 DIAGNOSIS — N63 Unspecified lump in breast: Secondary | ICD-10-CM | POA: Diagnosis not present

## 2016-06-15 DIAGNOSIS — R922 Inconclusive mammogram: Secondary | ICD-10-CM | POA: Diagnosis not present

## 2016-06-15 DIAGNOSIS — N6311 Unspecified lump in the right breast, upper outer quadrant: Secondary | ICD-10-CM

## 2016-06-15 DIAGNOSIS — N6489 Other specified disorders of breast: Secondary | ICD-10-CM | POA: Diagnosis not present

## 2016-06-27 DIAGNOSIS — Z23 Encounter for immunization: Secondary | ICD-10-CM | POA: Diagnosis not present

## 2016-07-05 ENCOUNTER — Other Ambulatory Visit: Payer: Self-pay | Admitting: Family Medicine

## 2016-07-05 NOTE — Telephone Encounter (Signed)
Patient requesting refill of Flonase.

## 2016-07-14 ENCOUNTER — Other Ambulatory Visit: Payer: Self-pay | Admitting: Family Medicine

## 2016-08-12 ENCOUNTER — Other Ambulatory Visit: Payer: Self-pay | Admitting: Family Medicine

## 2016-11-29 ENCOUNTER — Other Ambulatory Visit: Payer: Self-pay | Admitting: Family Medicine

## 2016-12-11 DIAGNOSIS — H5203 Hypermetropia, bilateral: Secondary | ICD-10-CM | POA: Diagnosis not present

## 2017-05-06 ENCOUNTER — Ambulatory Visit (INDEPENDENT_AMBULATORY_CARE_PROVIDER_SITE_OTHER): Payer: BLUE CROSS/BLUE SHIELD | Admitting: Family Medicine

## 2017-05-06 ENCOUNTER — Encounter: Payer: Self-pay | Admitting: Family Medicine

## 2017-05-06 DIAGNOSIS — Z124 Encounter for screening for malignant neoplasm of cervix: Secondary | ICD-10-CM

## 2017-05-06 DIAGNOSIS — Z1239 Encounter for other screening for malignant neoplasm of breast: Secondary | ICD-10-CM

## 2017-05-06 DIAGNOSIS — Z01419 Encounter for gynecological examination (general) (routine) without abnormal findings: Secondary | ICD-10-CM | POA: Diagnosis not present

## 2017-05-06 DIAGNOSIS — E559 Vitamin D deficiency, unspecified: Secondary | ICD-10-CM

## 2017-05-06 DIAGNOSIS — E669 Obesity, unspecified: Secondary | ICD-10-CM

## 2017-05-06 DIAGNOSIS — M1611 Unilateral primary osteoarthritis, right hip: Secondary | ICD-10-CM | POA: Insufficient documentation

## 2017-05-06 DIAGNOSIS — Z13220 Encounter for screening for lipoid disorders: Secondary | ICD-10-CM | POA: Diagnosis not present

## 2017-05-06 DIAGNOSIS — E66811 Obesity, class 1: Secondary | ICD-10-CM

## 2017-05-06 DIAGNOSIS — M25462 Effusion, left knee: Secondary | ICD-10-CM

## 2017-05-06 DIAGNOSIS — Z0001 Encounter for general adult medical examination with abnormal findings: Secondary | ICD-10-CM

## 2017-05-06 DIAGNOSIS — M2042 Other hammer toe(s) (acquired), left foot: Secondary | ICD-10-CM

## 2017-05-06 DIAGNOSIS — N3941 Urge incontinence: Secondary | ICD-10-CM | POA: Diagnosis not present

## 2017-05-06 DIAGNOSIS — R7303 Prediabetes: Secondary | ICD-10-CM | POA: Diagnosis not present

## 2017-05-06 LAB — CBC WITH DIFFERENTIAL/PLATELET
Basophils Absolute: 28 cells/uL (ref 0–200)
Basophils Relative: 1 %
EOS PCT: 3 %
Eosinophils Absolute: 84 cells/uL (ref 15–500)
HCT: 39.9 % (ref 35.0–45.0)
Hemoglobin: 12.6 g/dL (ref 11.7–15.5)
LYMPHS PCT: 47 %
Lymphs Abs: 1316 cells/uL (ref 850–3900)
MCH: 29.4 pg (ref 27.0–33.0)
MCHC: 31.6 g/dL — AB (ref 32.0–36.0)
MCV: 93.2 fL (ref 80.0–100.0)
MONOS PCT: 14 %
MPV: 8.8 fL (ref 7.5–12.5)
Monocytes Absolute: 392 cells/uL (ref 200–950)
NEUTROS PCT: 35 %
Neutro Abs: 980 cells/uL — ABNORMAL LOW (ref 1500–7800)
Platelets: 189 10*3/uL (ref 140–400)
RBC: 4.28 MIL/uL (ref 3.80–5.10)
RDW: 13.5 % (ref 11.0–15.0)
WBC: 2.8 10*3/uL — AB (ref 3.8–10.8)

## 2017-05-06 LAB — TSH: TSH: 1.53 m[IU]/L

## 2017-05-06 MED ORDER — ACETAMINOPHEN 500 MG PO TABS: 500.0000 mg | ORAL_TABLET | Freq: Four times a day (QID) | ORAL | 0 refills | Status: DC | PRN

## 2017-05-06 MED ORDER — MELOXICAM 15 MG PO TABS: 15.0000 mg | ORAL_TABLET | Freq: Every day | ORAL | 0 refills | Status: DC

## 2017-05-06 NOTE — Progress Notes (Signed)
Name: Kathy Benjamin   MRN: [id number]    DOB: Jan 31, 1956   Date:05/06/2017       Progress Note  Subjective  Chief Complaint  Chief Complaint  Patient presents with  . Annual Exam    HPI  Well Woman: she has urge incontinence, no stress incontinence symptoms, going on for over one year. No vaginal discharge, no sexually active for many years, no breast problems. She continues to swell on her legs and has noticed a corn on top of 2nd left toe, that is tender when she wear steel toe shoes for work.   Left knee effusion: she states she bumped her left knee a couple of times about 6 months ago, she continues to have mild effusion, also pain when first stands up, no instability, pain is described as aching like, no erythema or increase in warmth  Hammer toe: she has noticed corn formation on 2nd left toe for one year, but recently it started to cause pain when wearing her steel toe shoes  Fatigue: she continues to feel tired, we will check labs  Borderline DM: she denies polyphagia, but has polydipsia and polyuria. We will recheck labs   Patient Active Problem List   Diagnosis Date Noted  . Post herpetic neuralgia 08/15/2015  . Bunion of left foot 05/02/2015  . H/O: hysterectomy 05/02/2015  . Edema leg 04/29/2015  . Obesity (BMI 30.0-34.9) 04/29/2015  . Primary localized osteoarthrosis, lower leg 04/29/2015  . Perennial allergic rhinitis with seasonal variation 04/29/2015  . Borderline diabetes 04/29/2015  . Menopausal symptom 04/29/2015  . Vitamin D deficiency 04/29/2015  . Abnormal electrocardiogram 04/02/2007    Past Surgical History:  Procedure Laterality Date  . ABDOMINAL HYSTERECTOMY    . BREAST BIOPSY Right 2009   neg  . BREAST SURGERY Right [id number]   biopsy  . COLONOSCOPY    . COLONOSCOPY WITH PROPOFOL N/A 08/05/2015   Procedure: COLONOSCOPY WITH PROPOFOL;  Surgeon: Midge Minium, MD;  [facility];  Service: Endoscopy;  Laterality: N/A;  .  HERNIA REPAIR     umbilical    Family History  Problem Relation Age of Onset  . Diabetes Mother   . Hypertension Mother   . Stroke Father   . Hypertension Father   . Cancer Father   . Hyperthyroidism Sister   . Cancer Brother        prostate  . Hyperthyroidism Brother     Social History   Social History  . Marital status: Single    Spouse name: N/A  . Number of children: N/A  . Years of education: N/A   Occupational History  . Not on file.   Social History Main Topics  . Smoking status: Never Smoker  . Smokeless tobacco: Never Used  . Alcohol use No  . Drug use: No  . Sexual activity: Not Currently   Other Topics Concern  . Not on file   Social History Narrative  . No narrative on file     Current Outpatient Prescriptions:  .  Biotin 47829 MCG TABS, Take 1 tablet by mouth at bedtime., Disp: , Rfl:  .  Cholecalciferol (VITAMIN D) 2000 units CAPS, Take 1 capsule by mouth daily., Disp: , Rfl:  .  fluticasone (FLONASE) 50 MCG/ACT nasal spray, USE 2 SPRAYS IN EACH NOSTRIL EVERY DAY, Disp: 16 g, Rfl: 2 .  loratadine (CLARITIN) 10 MG tablet, TAKE 1 TABLET BY MOUTH TWICE A DAY, Disp: 60 tablet, Rfl: 0 .  meloxicam (MOBIC)  15 MG tablet, Take 1 tablet (15 mg total) by mouth daily., Disp: 30 tablet, Rfl: 0  No Known Allergies   ROS  Constitutional: Negative for fever, positive for weight change.  Respiratory: Negative for cough and shortness of breath.   Cardiovascular: Negative for chest pain or palpitations.  Gastrointestinal: Negative for abdominal pain, no bowel changes.  Musculoskeletal: Negative for gait problem , positive for mild left knee  joint swelling.  Skin: Negative for rash.  Neurological: Negative for dizziness or headache.  No other specific complaints in a complete review of systems (except as listed in HPI above).  Objective  Vitals:   05/06/17 0829  BP: 118/64  Pulse: 70  Resp: 16  Temp: 98.4 F (36.9 C)  SpO2: 96%  Weight: 193 lb 9  oz (87.8 kg)  Height: 5\' 3"  (1.6 m)    Body mass index is 34.29 kg/m.  Physical Exam  Constitutional: Patient appears well-developed and obese. No distress.  HENT: Head: Normocephalic and atraumatic. Ears: B TMs ok, no erythema or effusion; Nose: Nose normal. Mouth/Throat: Oropharynx is clear and moist. No oropharyngeal exudate.  Eyes: Conjunctivae and EOM are normal. Pupils are equal, round, and reactive to light. No scleral icterus.  Neck: Normal range of motion. Neck supple. No JVD present. No thyromegaly present.  Cardiovascular: Normal rate, regular rhythm and normal heart sounds.  No murmur heard. No BLE edema. Pulmonary/Chest: Effort normal and breath sounds normal. No respiratory distress. Abdominal: Soft. Bowel sounds are normal, no distension. There is no tenderness. no masses Breast: no lumps  no nipple discharge or rashes, left breast scar from previous biopsy and possible scar tissue above it, breast tissue on left axilla  FEMALE GENITALIA:  External genitalia normal External urethra normal Vaginal vault normal without discharge or lesions Cervix absent Bimanual exam normal without masses RECTAL: not done Musculoskeletal: Normal range of motion, effusion of left knee, no erythema or increase in warmth. She has  crepitus with extension of both knees, decrease rom of right hip , bunion and hammer toe on left foot Neurological: he is alert and oriented to person, place, and time. No cranial nerve deficit. Coordination, balance, strength, speech normal  Skin: Skin is warm and dry. No rash noted. No erythema.  Psychiatric: Patient has a normal mood and affect. behavior is normal. Judgment and thought content normal.  PHQ2/9: Depression screen [facility] 2/9 05/06/2017 05/02/2016 09/28/2015 07/20/2015 05/02/2015  Decreased Interest 0 0 0 0 0  Down, Depressed, Hopeless 0 0 0 0 0  PHQ - 2 Score 0 0 0 0 0     Fall Risk: Fall Risk  05/06/2017 09/28/2015 07/20/2015 05/02/2015  Falls in the  past year? No No No No    Functional Status Survey: Is the patient deaf or have difficulty hearing?: No Does the patient have difficulty seeing, even when wearing glasses/contacts?: No Does the patient have difficulty concentrating, remembering, or making decisions?: No Does the patient have difficulty walking or climbing stairs?: No Does the patient have difficulty dressing or bathing?: No Does the patient have difficulty doing errands alone such as visiting a doctor's office or shopping?: No   Assessment & Plan  1. Well woman exam  Discussed importance of 150 minutes of physical activity weekly, eat two servings of fish weekly, eat one serving of tree nuts ( cashews, pistachios, pecans, almonds.[name]) every other day, eat 6 servings of fruit/vegetables daily and drink plenty of water and avoid sweet beverages.  - COMPLETE METABOLIC PANEL WITH  GFR - CBC with Differential/Platelet - Vitamin B12  2. Lipid screening  - Lipid panel  3. Breast cancer screening  - MM Digital Screening; Future  4. Borderline diabetes  - Hemoglobin A1c - Insulin, fasting  5. Vitamin D deficiency  - VITAMIN D 25 Hydroxy (Vit-D Deficiency, Fractures)  6. Obesity (BMI 30.0-34.9)  Discussed with the patient the risk posed by an increased BMI. Discussed importance of portion control, calorie counting and at least 150 minutes of physical activity weekly. Avoid sweet beverages and drink more water. Eat at least 6 servings of fruit and vegetables daily   7. Hammer toe of left foot  - Ambulatory referral to Podiatry  8. Effusion of left knee  - meloxicam (MOBIC) 15 MG tablet; Take 1 tablet (15 mg total) by mouth daily.  Dispense: 30 tablet; Refill: 0   9. Urge incontinence of urine  Discussed therapy, but she would like to hold off

## 2017-05-06 NOTE — Patient Instructions (Signed)
Preventive Care 40-64 Years, Female Preventive care refers to lifestyle choices and visits with your health care provider that can promote health and wellness. What does preventive care include?  A yearly physical exam. This is also called an annual well check.  Dental exams once or twice a year.  Routine eye exams. Ask your health care provider how often you should have your eyes checked.  Personal lifestyle choices, including: ? Daily care of your teeth and gums. ? Regular physical activity. ? Eating a healthy diet. ? Avoiding tobacco and drug use. ? Limiting alcohol use. ? Practicing safe sex. ? Taking low-dose aspirin daily starting at age 58. ? Taking vitamin and mineral supplements as recommended by your health care provider. What happens during an annual well check? The services and screenings done by your health care provider during your annual well check will depend on your age, overall health, lifestyle risk factors, and family history of disease. Counseling Your health care provider may ask you questions about your:  Alcohol use.  Tobacco use.  Drug use.  Emotional well-being.  Home and relationship well-being.  Sexual activity.  Eating habits.  Work and work Statistician.  Method of birth control.  Menstrual cycle.  Pregnancy history.  Screening You may have the following tests or measurements:  Height, weight, and BMI.  Blood pressure.  Lipid and cholesterol levels. These may be checked every 5 years, or more frequently if you are over 81 years old.  Skin check.  Lung cancer screening. You may have this screening every year starting at age 78 if you have a 30-pack-year history of smoking and currently smoke or have quit within the past 15 years.  Fecal occult blood test (FOBT) of the stool. You may have this test every year starting at age 65.  Flexible sigmoidoscopy or colonoscopy. You may have a sigmoidoscopy every 5 years or a colonoscopy  every 10 years starting at age 30.  Hepatitis C blood test.  Hepatitis B blood test.  Sexually transmitted disease (STD) testing.  Diabetes screening. This is done by checking your blood sugar (glucose) after you have not eaten for a while (fasting). You may have this done every 1-3 years.  Mammogram. This may be done every 1-2 years. Talk to your health care provider about when you should start having regular mammograms. This may depend on whether you have a family history of breast cancer.  BRCA-related cancer screening. This may be done if you have a family history of breast, ovarian, tubal, or peritoneal cancers.  Pelvic exam and Pap test. This may be done every 3 years starting at age 80. Starting at age 36, this may be done every 5 years if you have a Pap test in combination with an HPV test.  Bone density scan. This is done to screen for osteoporosis. You may have this scan if you are at high risk for osteoporosis.  Discuss your test results, treatment options, and if necessary, the need for more tests with your health care provider. Vaccines Your health care provider may recommend certain vaccines, such as:  Influenza vaccine. This is recommended every year.  Tetanus, diphtheria, and acellular pertussis (Tdap, Td) vaccine. You may need a Td booster every 10 years.  Varicella vaccine. You may need this if you have not been vaccinated.  Zoster vaccine. You may need this after age 5.  Measles, mumps, and rubella (MMR) vaccine. You may need at least one dose of MMR if you were born in  1955 or later. You may also need a second dose.  Pneumococcal 13-valent conjugate (PCV13) vaccine. You may need this if you have certain conditions and were not previously vaccinated.  Pneumococcal polysaccharide (PPSV23) vaccine. You may need one or two doses if you smoke cigarettes or if you have certain conditions.  Meningococcal vaccine. You may need this if you have certain  conditions.  Hepatitis A vaccine. You may need this if you have certain conditions or if you travel or work in places where you may be exposed to hepatitis A.  Hepatitis B vaccine. You may need this if you have certain conditions or if you travel or work in places where you may be exposed to hepatitis B.  Haemophilus influenzae type b (Hib) vaccine. You may need this if you have certain conditions.  Talk to your health care provider about which screenings and vaccines you need and how often you need them. This information is not intended to replace advice given to you by your health care provider. Make sure you discuss any questions you have with your health care provider. Document Released: 09/30/2015 Document Revised: 05/23/2016 Document Reviewed: 07/05/2015 Elsevier Interactive Patient Education  2017 [employer].

## 2017-05-07 LAB — COMPLETE METABOLIC PANEL WITH GFR
ALBUMIN: 3.8 g/dL (ref 3.6–5.1)
ALK PHOS: 32 U/L — AB (ref 33–130)
ALT: 8 U/L (ref 6–29)
AST: 13 U/L (ref 10–35)
BILIRUBIN TOTAL: 1.1 mg/dL (ref 0.2–1.2)
BUN: 14 mg/dL (ref 7–25)
CALCIUM: 8.8 mg/dL (ref 8.6–10.4)
CO2: 24 mmol/L (ref 20–32)
CREATININE: 0.65 mg/dL (ref 0.50–1.05)
Chloride: 109 mmol/L (ref 98–110)
GFR, Est African American: >89 mL/min (ref >=60)
GFR, Est Non African American: >89 mL/min (ref >=60)
Glucose, Bld: 84 mg/dL (ref 65–99)
POTASSIUM: 3.5 mmol/L (ref 3.5–5.3)
Sodium: 144 mmol/L (ref 135–146)
TOTAL PROTEIN: 6 g/dL — AB (ref 6.1–8.1)

## 2017-05-07 LAB — LIPID PANEL
CHOLESTEROL: 166 mg/dL (ref <200)
HDL: 56 mg/dL (ref >50)
LDL CALC: 96 mg/dL (ref <100)
TRIGLYCERIDES: 72 mg/dL (ref <150)
Total CHOL/HDL Ratio: 3 Ratio (ref <5.0)
VLDL: 14 mg/dL (ref <30)

## 2017-05-07 LAB — INSULIN, FASTING: INSULIN FASTING, SERUM: 4.1 u[IU]/mL (ref 2.0–19.6)

## 2017-05-07 LAB — VITAMIN B12: VITAMIN B 12: 314 pg/mL (ref 200–1100)

## 2017-05-07 LAB — VITAMIN D 25 HYDROXY (VIT D DEFICIENCY, FRACTURES): Vit D, 25-Hydroxy: 30 ng/mL (ref 30–100)

## 2017-05-07 LAB — HEMOGLOBIN A1C
HEMOGLOBIN A1C: 5.5 % (ref <5.7)
MEAN PLASMA GLUCOSE: 111 mg/dL

## 2017-05-08 ENCOUNTER — Telehealth: Payer: Self-pay | Admitting: Family Medicine

## 2017-05-08 NOTE — Telephone Encounter (Signed)
Pt would like a call back about her knee medication.

## 2017-05-10 ENCOUNTER — Other Ambulatory Visit: Payer: Self-pay | Admitting: Family Medicine

## 2017-05-10 DIAGNOSIS — D72829 Elevated white blood cell count, unspecified: Secondary | ICD-10-CM | POA: Insufficient documentation

## 2017-05-10 NOTE — Telephone Encounter (Signed)
Attempted to contact pt but no answer and she does not have a voicemail set up

## 2017-05-15 ENCOUNTER — Telehealth: Payer: Self-pay | Admitting: Family Medicine

## 2017-05-15 NOTE — Telephone Encounter (Signed)
Spoke with patient and she is not able to come in today. She scheduled appointment for Friday.

## 2017-05-15 NOTE — Telephone Encounter (Signed)
Pt was given medication for her knee the other week. She has developed a rash and her ankles have swollen. Not sure if it due to the medication but she knows that its a side effect. Please return call [phone]

## 2017-05-15 NOTE — Telephone Encounter (Signed)
Yes. It may be related to medication. Because of rash she may want to come in to be seen today

## 2017-05-17 ENCOUNTER — Encounter: Payer: Self-pay | Admitting: Family Medicine

## 2017-05-17 ENCOUNTER — Ambulatory Visit (INDEPENDENT_AMBULATORY_CARE_PROVIDER_SITE_OTHER): Payer: BLUE CROSS/BLUE SHIELD | Admitting: Family Medicine

## 2017-05-17 VITALS — BP 118/72 | HR 72 | Temp 97.7°F | Resp 16 | Ht 63.0 in | Wt 191.4 lb

## 2017-05-17 DIAGNOSIS — M25462 Effusion, left knee: Secondary | ICD-10-CM | POA: Diagnosis not present

## 2017-05-17 DIAGNOSIS — Z23 Encounter for immunization: Secondary | ICD-10-CM | POA: Diagnosis not present

## 2017-05-17 DIAGNOSIS — R21 Rash and other nonspecific skin eruption: Secondary | ICD-10-CM | POA: Diagnosis not present

## 2017-05-17 DIAGNOSIS — D72819 Decreased white blood cell count, unspecified: Secondary | ICD-10-CM | POA: Diagnosis not present

## 2017-05-17 LAB — CBC WITH DIFFERENTIAL/PLATELET
BASOS ABS: 41 {cells}/uL (ref 0–200)
Basophils Relative: 1 %
Eosinophils Absolute: 123 cells/uL (ref 15–500)
Eosinophils Relative: 3 %
HCT: 37.5 % (ref 35.0–45.0)
Hemoglobin: 12 g/dL (ref 11.7–15.5)
Lymphocytes Relative: 45 %
Lymphs Abs: 1845 cells/uL (ref 850–3900)
MCH: 29.6 pg (ref 27.0–33.0)
MCHC: 32 g/dL (ref 32.0–36.0)
MCV: 92.4 fL (ref 80.0–100.0)
MONOS PCT: 15 %
MPV: 8.8 fL (ref 7.5–12.5)
Monocytes Absolute: 615 cells/uL (ref 200–950)
NEUTROS ABS: 1476 {cells}/uL — AB (ref 1500–7800)
Neutrophils Relative %: 36 %
PLATELETS: 208 10*3/uL (ref 140–400)
RBC: 4.06 MIL/uL (ref 3.80–5.10)
RDW: 13.8 % (ref 11.0–15.0)
WBC: 4.1 10*3/uL (ref 3.8–10.8)

## 2017-05-17 NOTE — Progress Notes (Signed)
Name: Kathy Benjamin   MRN: [id number]    DOB: [birth year]   Date:05/17/2017       Progress Note  Subjective  Chief Complaint  Chief Complaint  Patient presents with  . Rash    HPI  Left knee effusion: she noticed improvement of effusion while taking Meloxicam, but after one week she noticed that right ankle had a rash , and some edema on both lower legs, we told her to stop medication and swelling resolved, rash also resolved, but left knee has mild effusion again, she states only has pain at night after work. Advised to resume Tylenol four times daily to control pain, discussed X-ray but she wants to hold off, also advised to resume Meloxicam to make sure the rash was not related to NSAID's.   Leucopenia: white count was low and we will recheck it today   Patient Active Problem List   Diagnosis Date Noted  . Leucocytosis 05/10/2017  . Urge incontinence of urine 05/06/2017  . Osteoarthritis of right hip 05/06/2017  . Post herpetic neuralgia 08/15/2015  . Bunion of left foot 05/02/2015  . H/O: hysterectomy 05/02/2015  . Edema leg 04/29/2015  . Obesity (BMI 30.0-34.9) 04/29/2015  . Primary localized osteoarthrosis, lower leg 04/29/2015  . Perennial allergic rhinitis with seasonal variation 04/29/2015  . Borderline diabetes 04/29/2015  . Menopausal symptom 04/29/2015  . Vitamin D deficiency 04/29/2015  . Abnormal electrocardiogram 04/02/2007    Past Surgical History:  Procedure Laterality Date  . ABDOMINAL HYSTERECTOMY    . BREAST BIOPSY Right 2009   neg  . BREAST SURGERY Right [id number]   biopsy  . COLONOSCOPY    . COLONOSCOPY WITH PROPOFOL N/A 08/05/2015   Procedure: COLONOSCOPY WITH PROPOFOL;  Surgeon: Lucilla Lame, MD;  Location: [facility];  Service: Endoscopy;  Laterality: N/A;  . HERNIA REPAIR     umbilical    Family History  Problem Relation Age of Onset  . Diabetes Mother   . Hypertension Mother   . Stroke Father   . Hypertension Father   .  Cancer Father   . Hyperthyroidism Sister   . Cancer Brother        prostate  . Hyperthyroidism Brother     Social History   Social History  . Marital status: Single    Spouse name: N/A  . Number of children: N/A  . Years of education: N/A   Occupational History  . Not on file.   Social History Main Topics  . Smoking status: Never Smoker  . Smokeless tobacco: Never Used  . Alcohol use No  . Drug use: No  . Sexual activity: Not Currently   Other Topics Concern  . Not on file   Social History Narrative  . No narrative on file     Current Outpatient Prescriptions:  .  acetaminophen (TYLENOL) 500 MG tablet, Take 1 tablet (500 mg total) by mouth every 6 (six) hours as needed., Disp: 30 tablet, Rfl: 0 .  Biotin 10000 MCG TABS, Take 1 tablet by mouth at bedtime., Disp: , Rfl:  .  Cholecalciferol (VITAMIN D) 2000 units CAPS, Take 1 capsule by mouth daily., Disp: , Rfl:  .  fluticasone (FLONASE) 50 MCG/ACT nasal spray, USE 2 SPRAYS IN EACH NOSTRIL EVERY DAY, Disp: 16 g, Rfl: 2 .  loratadine (CLARITIN) 10 MG tablet, TAKE 1 TABLET BY MOUTH TWICE A DAY, Disp: 60 tablet, Rfl: 0 .  meloxicam (MOBIC) 15 MG tablet, Take 1 tablet (15 mg total)  by mouth daily., Disp: 30 tablet, Rfl: 0  No Known Allergies   ROS  Ten systems reviewed and is negative except as mentioned in HPI   Objective  Vitals:   05/17/17 1516  BP: 118/72  Pulse: 72  Resp: 16  Temp: 97.7 F (36.5 C)  TempSrc: Oral  SpO2: 96%  Weight: 191 lb 6.4 oz (86.8 kg)  Height: 5' 3"  (1.6 m)    Body mass index is 33.9 kg/m.  Physical Exam  Constitutional: Patient appears well-developed and well-nourished. Obese No distress.  HEENT: head atraumatic, normocephalic, pupils equal and reactive to light,  neck supple, throat within normal limits Cardiovascular: Normal rate, regular rhythm and normal heart sounds.  No murmur heard. No BLE edema. Pulmonary/Chest: Effort normal and breath sounds normal. No respiratory  distress. Abdominal: Soft.  There is no tenderness. Psychiatric: Patient has a normal mood and affect. behavior is normal. Judgment and thought content normal. Muscular Skeletal: crepitus with extension both knees and mild effusion of left knee, no erythema Skin: no rashes  Recent Results (from the past 2160 hour(s))  TSH     Status: None   Collection Time: 05/06/17  9:14 AM  Result Value Ref Range   TSH 1.53 mIU/L    Comment:   Reference Range   > or = 20 Years  0.40-4.50   Pregnancy Range First trimester  0.26-2.66 Second trimester 0.55-2.73 Third trimester  0.43-2.91     Hemoglobin A1c     Status: None   Collection Time: 05/06/17  9:28 AM  Result Value Ref Range   Hgb A1c MFr Bld 5.5 <5.7 %    Comment:   For the purpose of screening for the presence of diabetes:   <5.7%       Consistent with the absence of diabetes 5.7-6.4 %   Consistent with increased risk for diabetes (prediabetes) >=6.5 %     Consistent with diabetes   This assay result is consistent with a decreased risk of diabetes.   Currently, no consensus exists regarding use of hemoglobin A1c for diagnosis of diabetes in children.   According to American Diabetes Association (ADA) guidelines, hemoglobin A1c <7.0% represents optimal control in non-pregnant diabetic patients. Different metrics may apply to specific patient populations. Standards of Medical Care in Diabetes (ADA).      Mean Plasma Glucose 111 mg/dL  COMPLETE METABOLIC PANEL WITH GFR     Status: Abnormal   Collection Time: 05/06/17  9:28 AM  Result Value Ref Range   Sodium 144 135 - 146 mmol/L   Potassium 3.5 3.5 - 5.3 mmol/L   Chloride 109 98 - 110 mmol/L   CO2 24 20 - 32 mmol/L    Comment: ** Please note change in reference range(s). **      Glucose, Bld 84 65 - 99 mg/dL   BUN 14 7 - 25 mg/dL   Creat 0.65 0.50 - 1.05 mg/dL    Comment:   For patients > or = 59 years of age: The upper reference limit for Creatinine is approximately  13% higher for people identified as African-American.      Total Bilirubin 1.1 0.2 - 1.2 mg/dL   Alkaline Phosphatase 32 (L) 33 - 130 U/L   AST 13 10 - 35 U/L   ALT 8 6 - 29 U/L   Total Protein 6.0 (L) 6.1 - 8.1 g/dL   Albumin 3.8 3.6 - 5.1 g/dL   Calcium 8.8 8.6 - 10.4 mg/dL   GFR, Est  African American >89 >=60 mL/min   GFR, Est Non African American >89 >=60 mL/min  CBC with Differential/Platelet     Status: Abnormal   Collection Time: 05/06/17  9:28 AM  Result Value Ref Range   WBC 2.8 (L) 3.8 - 10.8 K/uL   RBC 4.28 3.80 - 5.10 MIL/uL   Hemoglobin 12.6 11.7 - 15.5 g/dL   HCT 39.9 35.0 - 45.0 %   MCV 93.2 80.0 - 100.0 fL   MCH 29.4 27.0 - 33.0 pg   MCHC 31.6 (L) 32.0 - 36.0 g/dL   RDW 13.5 11.0 - 15.0 %   Platelets 189 140 - 400 K/uL   MPV 8.8 7.5 - 12.5 fL   Neutro Abs 980 (L) 1,500 - 7,800 cells/uL   Lymphs Abs 1,316 850 - 3,900 cells/uL   Monocytes Absolute 392 200 - 950 cells/uL   Eosinophils Absolute 84 15 - 500 cells/uL   Basophils Absolute 28 0 - 200 cells/uL   Neutrophils Relative % 35 %   Lymphocytes Relative 47 %   Monocytes Relative 14 %   Eosinophils Relative 3 %   Basophils Relative 1 %   Smear Review Criteria for review not met   Lipid panel     Status: None   Collection Time: 05/06/17  9:28 AM  Result Value Ref Range   Cholesterol 166 <200 mg/dL   Triglycerides 72 <150 mg/dL   HDL 56 >50 mg/dL   Total CHOL/HDL Ratio 3.0 <5.0 Ratio   VLDL 14 <30 mg/dL   LDL Cholesterol 96 <100 mg/dL  VITAMIN D 25 Hydroxy (Vit-D Deficiency, Fractures)     Status: None   Collection Time: 05/06/17  9:28 AM  Result Value Ref Range   Vit D, 25-Hydroxy 30 30 - 100 ng/mL    Comment: Vitamin D Status           25-OH Vitamin D        Deficiency                <20 ng/mL        Insufficiency         20 - 29 ng/mL        Optimal             > or = 30 ng/mL   For 25-OH Vitamin D testing on patients on D2-supplementation and patients for whom quantitation of D2 and D3  fractions is required, the QuestAssureD 25-OH VIT D, (D2,D3), LC/MS/MS is recommended: order code [id number] (patients > 2 yrs).   Vitamin B12     Status: None   Collection Time: 05/06/17  9:28 AM  Result Value Ref Range   Vitamin B-12 314 200 - 1,100 pg/mL  Insulin, fasting     Status: None   Collection Time: 05/06/17  9:28 AM  Result Value Ref Range   Insulin fasting, serum 4.1 2.0 - 19.6 uIU/mL    Comment:   This insulin assay shows strong cross-reactivity for some insulin analogs (lispro, aspart, and glargine) and much lower cross-reactivity with others (detemir, glulisine).   Stimulated Insulin reference intervals were established using the Siemens Immulite assay. These values are provided for general guidance only.       PHQ2/9: Depression screen [facility] 2/9 05/06/2017 05/02/2016 09/28/2015 07/20/2015 05/02/2015  Decreased Interest 0 0 0 0 0  Down, Depressed, Hopeless 0 0 0 0 0  PHQ - 2 Score 0 0 0 0 0     Fall Risk: Fall Risk  05/06/2017 09/28/2015 07/20/2015 05/02/2015  Falls in the past year? No No No No     Assessment & Plan  1. Effusion of left knee  Likely from OA, took Meloxicam and improved, she will try going back and see if rash on right ankle does not return, after that take Tylenol only up to four times daily to control pain She will call back if no improvement for referral to Ortho or for X-ray of knee weight bearing   2. Rash  Resolved, likely unrelated to the meloxicam   3. Leukopenia, unspecified type  Recheck today - CBC with Differential/Platelet

## 2017-05-29 ENCOUNTER — Ambulatory Visit: Payer: Self-pay | Admitting: Podiatry

## 2017-06-02 ENCOUNTER — Other Ambulatory Visit: Payer: Self-pay | Admitting: Family Medicine

## 2017-06-02 DIAGNOSIS — M25462 Effusion, left knee: Secondary | ICD-10-CM

## 2017-06-03 NOTE — Telephone Encounter (Signed)
Patient requesting refill of Meloxicam to [facility].  

## 2017-06-10 ENCOUNTER — Other Ambulatory Visit: Payer: Self-pay | Admitting: Family Medicine

## 2017-06-10 NOTE — Telephone Encounter (Signed)
Patient requesting refill of Loratadine to [facility].  

## 2017-06-17 ENCOUNTER — Other Ambulatory Visit: Payer: Self-pay | Admitting: Family Medicine

## 2017-06-17 ENCOUNTER — Ambulatory Visit
Admission: RE | Admit: 2017-06-17 | Discharge: 2017-06-17 | Disposition: A | Discharge status: Home / Self Care | Payer: BLUE CROSS/BLUE SHIELD | Source: Ambulatory Visit | Attending: Family Medicine | Admitting: Family Medicine

## 2017-06-17 DIAGNOSIS — Z1239 Encounter for other screening for malignant neoplasm of breast: Secondary | ICD-10-CM

## 2017-06-17 DIAGNOSIS — Z1231 Encounter for screening mammogram for malignant neoplasm of breast: Secondary | ICD-10-CM | POA: Diagnosis not present

## 2017-06-28 ENCOUNTER — Ambulatory Visit: Payer: Self-pay | Admitting: Podiatry

## 2017-07-07 ENCOUNTER — Other Ambulatory Visit: Payer: Self-pay | Admitting: Family Medicine

## 2017-07-18 ENCOUNTER — Telehealth: Payer: Self-pay | Admitting: Family Medicine

## 2017-07-18 NOTE — Telephone Encounter (Signed)
Copied from CRM [phone]#[id number]. Topic: Quick Communication - See Telephone Encounter >> Jul 18, 2017  9:29 AM Landry MellowFoltz, Melissa J wrote: CRM for notification. See Telephone encounter for:  07/18/17. Pt was given meloxiam for swelling, then she got a rash so she stopped medication.  She is still having swelling in her knee.  She is taking Aleve 1 twice a day and the pain is better, but the swelling is still there.  When she took the meloxicam, it did help the pain, but not the swelling. She is asking for a different rx that will not cause rash.  cb number is [phone]

## 2017-07-18 NOTE — Telephone Encounter (Signed)
Left message for patient to take Tylenol until she could come in to be seen for reaction to medication.

## 2017-07-18 NOTE — Telephone Encounter (Signed)
Pt is asking for a different pain med like the Meloxicam, that will not cause a rash.

## 2017-07-18 NOTE — Telephone Encounter (Signed)
She can only take Tylenol until she comes in

## 2017-07-18 NOTE — Telephone Encounter (Signed)
Called patient to see if she had gotten the message regarding the Tylenol. No answer, left message to call back.

## 2017-07-26 ENCOUNTER — Telehealth: Payer: Self-pay | Admitting: Family Medicine

## 2017-07-26 ENCOUNTER — Ambulatory Visit (INDEPENDENT_AMBULATORY_CARE_PROVIDER_SITE_OTHER): Payer: BLUE CROSS/BLUE SHIELD

## 2017-07-26 ENCOUNTER — Ambulatory Visit (INDEPENDENT_AMBULATORY_CARE_PROVIDER_SITE_OTHER): Payer: BLUE CROSS/BLUE SHIELD | Admitting: Podiatry

## 2017-07-26 ENCOUNTER — Encounter: Payer: Self-pay | Admitting: Podiatry

## 2017-07-26 VITALS — BP 112/72 | HR 72 | Temp 98.4°F | Resp 16

## 2017-07-26 DIAGNOSIS — M2042 Other hammer toe(s) (acquired), left foot: Secondary | ICD-10-CM

## 2017-07-26 DIAGNOSIS — M21619 Bunion of unspecified foot: Secondary | ICD-10-CM

## 2017-07-26 NOTE — Telephone Encounter (Signed)
Pt had bumped her knee before and was put on Mobic and Extra strength Tylenol. She was wondering if this med had made her ankles swell a little,so she had stopped taking it. But also she has a hx of some edema in her ankles. Advised to try the mobic once again for the pain to see if the swelling increases and let us know. She was agreeable with that.

## 2017-07-26 NOTE — Progress Notes (Signed)
   Subjective:    Patient ID: Kathy Benjamin, female    DOB: 02/01/1956, 61 y.o.   MRN: [id number]  HPI    Review of Systems  All other systems reviewed and are negative.      Objective:   Physical Exam        Assessment & Plan:

## 2017-07-29 NOTE — Progress Notes (Signed)
   Subjective: 61 year old female presents today as a new patient with a complaint of a painful hammertoe of the 2nd digit of the left foot that has been symptomatic for the past year. Wearing shoes increases the pain. Shoes that are not tight help alleviate the pain. She has been wearing corn pads as treatment. She is here for further evaluation and treatment.    Past Medical History:  Diagnosis Date  . Allergy   . Edema   . Environmental allergies   . Insomnia   . Menopausal and perimenopausal disorder   . Obesity   . Osteoporosis   . Pre-diabetes   . Shingles 07/14/15   right ear and neck.  Completed valtrex. scabbing over  . Tendonitis   . Vitamin A deficiency      Objective: Physical Exam General: The patient is alert and oriented x3 in no acute distress.  Dermatology: Skin is cool, dry and supple bilateral lower extremities. Negative for open lesions or macerations.  Vascular: Palpable pedal pulses bilaterally. No edema or erythema noted. Capillary refill within normal limits.  Neurological: Epicritic and protective threshold grossly intact bilaterally.   Musculoskeletal Exam: Clinical evidence of bunion deformity noted to the respective foot. There is a moderate pain on palpation range of motion of the first MPJ. Lateral deviation of the hallux noted consistent with hallux abductovalgus. Hammertoe contracture also noted on clinical exam to digit 2 of the bilateral feet. Symptomatic pain on palpation and range of motion also noted to the metatarsal phalangeal joints of the respective hammertoe digits.    Radiographic Exam: Increased intermetatarsal angle greater than 15 with a hallux abductus angle greater than 30 noted on AP view. Moderate degenerative changes noted within the first MPJ. Contracture deformity also noted to the interphalangeal joints and MPJs of the digits of the respective hammertoes.    Assessment: 1. 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 Hammertoe deformity 2nd digit of bilateral lower extremity   Plan of Care:  1. Patient was evaluated. X-Rays reviewed. 2. Discussed surgery vs. conservative treatment. 3. Patient opts for conservative treatment at this time. 4. Recommended good, wide shoe gear. 5. Return to clinic when necessary if ready for surgery.    Felecia ShellingBrent M. Quinteria Chisum, DPM [facility]  Dr. Felecia ShellingBrent M. Natanael Saladin, DPM    [address]                                        [place], [place] [address]                Office [phone]  Fax [phone]

## 2017-10-07 ENCOUNTER — Encounter: Payer: Self-pay | Admitting: Family Medicine

## 2017-10-07 ENCOUNTER — Ambulatory Visit: Payer: BLUE CROSS/BLUE SHIELD | Admitting: Family Medicine

## 2017-10-07 VITALS — BP 130/80 | HR 72 | Temp 98.4°F | Resp 16 | Ht 63.0 in | Wt 196.6 lb

## 2017-10-07 DIAGNOSIS — M1712 Unilateral primary osteoarthritis, left knee: Secondary | ICD-10-CM

## 2017-10-07 DIAGNOSIS — N3001 Acute cystitis with hematuria: Secondary | ICD-10-CM | POA: Diagnosis not present

## 2017-10-07 DIAGNOSIS — M545 Low back pain, unspecified: Secondary | ICD-10-CM

## 2017-10-07 LAB — POCT URINALYSIS DIPSTICK
Bilirubin, UA: NEGATIVE
Glucose, UA: NEGATIVE
KETONES UA: NEGATIVE
NITRITE UA: NEGATIVE
PROTEIN UA: 30
Spec Grav, UA: 1.02 (ref 1.010–1.025)
Urobilinogen, UA: NEGATIVE E.U./dL — AB
pH, UA: 5 (ref 5.0–8.0)

## 2017-10-07 MED ORDER — DICLOFENAC SODIUM 1 % TD GEL: 2.0000 g | Freq: Three times a day (TID) | TRANSDERMAL | 1 refills | Status: DC | PRN

## 2017-10-07 MED ORDER — SULFAMETHOXAZOLE-TRIMETHOPRIM 800-160 MG PO TABS: 1.0000 | ORAL_TABLET | Freq: Two times a day (BID) | ORAL | 0 refills | Status: AC

## 2017-10-07 NOTE — Patient Instructions (Addendum)

## 2017-10-07 NOTE — Progress Notes (Signed)
Name: Kathy Benjamin   MRN: [id number]    DOB: [birth year]   Date:10/07/2017       Progress Note  Subjective  Chief Complaint  Chief Complaint  Patient presents with  . Back Pain    for 1 week    HPI  PT presents with concern for low back pain ongoing for about 1 week with foul smelling urine; endorses urinary frequency.  She has not abdominal pain, flank pain, nausea, vomiting, fevers/chills. No history of kidney stones.  OA of the LEFT Knee: She has ongoing LEFT knee OA, takes Tylenol PRN but it doesn't always help.  She wants to avoid PO NSAIDs because she wants to avoid hurting her kidneys.  Would like to try a topical if possible today.  She denies numbness or tingling, extremity weakness.  Patient Active Problem List   Diagnosis Date Noted  . Leucocytosis 05/10/2017  . Urge incontinence of urine 05/06/2017  . Osteoarthritis of right hip 05/06/2017  . Post herpetic neuralgia 08/15/2015  . Bunion of left foot 05/02/2015  . H/O: hysterectomy 05/02/2015  . Edema leg 04/29/2015  . Obesity (BMI 30.0-34.9) 04/29/2015  . Primary localized osteoarthrosis, lower leg 04/29/2015  . Perennial allergic rhinitis with seasonal variation 04/29/2015  . Borderline diabetes 04/29/2015  . Menopausal symptom 04/29/2015  . Vitamin D deficiency 04/29/2015  . Abnormal electrocardiogram 04/02/2007    Social History   Tobacco Use  . Smoking status: Never Smoker  . Smokeless tobacco: Never Used  Substance Use Topics  . Alcohol use: No    Alcohol/week: 0.0 oz     Current Outpatient Medications:  .  acetaminophen (TYLENOL) 500 MG tablet, Take 1 tablet (500 mg total) by mouth every 6 (six) hours as needed., Disp: 30 tablet, Rfl: 0 .  Biotin 08657 MCG TABS, Take 1 tablet by mouth at bedtime., Disp: , Rfl:  .  Cholecalciferol (VITAMIN D) 2000 units CAPS, Take 1 capsule by mouth daily., Disp: , Rfl:  .  fluticasone (FLONASE) 50 MCG/ACT nasal spray, USE 2 SPRAYS IN EACH NOSTRIL EVERY DAY,  Disp: 16 g, Rfl: 2 .  loratadine (CLARITIN) 10 MG tablet, TAKE 1 TABLET BY MOUTH TWICE A DAY, Disp: 60 tablet, Rfl: 0 .  diclofenac sodium (VOLTAREN) 1 % GEL, Apply 2 g topically 3 (three) times daily as needed., Disp: 100 g, Rfl: 1 .  sulfamethoxazole-trimethoprim (BACTRIM DS,SEPTRA DS) 800-160 MG tablet, Take 1 tablet by mouth 2 (two) times daily for 3 days., Disp: 6 tablet, Rfl: 0  No Known Allergies  ROS  Constitutional: Negative for fever or weight change.  Respiratory: Negative for cough and shortness of breath.   Cardiovascular: Negative for chest pain or palpitations.  Gastrointestinal: Negative for abdominal pain, no bowel changes.  Musculoskeletal: Negative for gait problem or joint swelling.  Skin: Negative for rash.  Neurological: Negative for dizziness or headache.  No other specific complaints in a complete review of systems (except as listed in HPI above).  Objective  Vitals:   10/07/17 1421  BP: 130/80  Pulse: 72  Resp: 16  Temp: 98.4 F (36.9 C)  TempSrc: Oral  SpO2: 94%  Weight: 196 lb 9.6 oz (89.2 kg)  Height: 5\' 3"  (1.6 m)   Body mass index is 34.83 kg/m.  Nursing Note and Vital Signs reviewed.  Physical Exam  Constitutional: Patient appears well-developed and well-nourished. Obese. No distress.  HEENT: head atraumatic, normocephalic Cardiovascular: Normal rate, regular rhythm, S1/S2 present.  No murmur or rub heard. No  BLE edema. Pulmonary/Chest: Effort normal and breath sounds clear. No respiratory distress or retractions. Abdominal: Soft and non-tender, bowel sounds present x4 quadrants.  No CVA Tenderness Psychiatric: Patient has a normal mood and affect. behavior is normal. Judgment and thought content normal. Musculoskeletal: Normal range of motion, no joint effusions. No gross deformities. Mild crepitus to LEFT knee present. LE strength equal and +5 bilaterally.   Results for orders placed or performed in visit on 10/07/17 (from the past 72  hour(s))  POCT urinalysis dipstick     Status: Abnormal   Collection Time: 10/07/17  2:24 PM  Result Value Ref Range   Color, UA yellow    Clarity, UA cloudy    Glucose, UA negative    Bilirubin, UA negative    Ketones, UA negative    Spec Grav, UA 1.020 1.010 - 1.025   Blood, UA 50+    pH, UA 5.0 5.0 - 8.0   Protein, UA 30    Urobilinogen, UA negative (A) 0.2 or 1.0 E.U./dL   Nitrite, UA negative    Leukocytes, UA Large (3+) (A) Negative   Appearance cloudy    Odor none     Assessment & Plan  1. Acute cystitis with hematuria - POCT urinalysis dipstick - Urine Culture - sulfamethoxazole-trimethoprim (BACTRIM DS,SEPTRA DS) 800-160 MG tablet; Take 1 tablet by mouth 2 (two) times daily for 3 days.  Dispense: 6 tablet; Refill: 0 - She will return in 2 weeks for POCT UA Dip to ensure resolution of Hematuria.  2. Acute bilateral low back pain without sciatica - Urine Culture - We will treat for cystitis, but pt is advised to RTC if pain is not improving after treatment for further evaluation.  3. Primary localized osteoarthrosis of left lower leg - diclofenac sodium (VOLTAREN) 1 % GEL; Apply 2 g topically 3 (three) times daily as needed.  Dispense: 100 g; Refill: 1  -Red flags and when to present for emergency care or RTC including fever >101.40F, chest pain, shortness of breath, new/worsening/un-resolving symptoms,severe abdominal/flank/back pain, vomiting, frank blood in urine  reviewed with patient at time of visit. Follow up and care instructions discussed and provided in AVS.

## 2017-10-08 LAB — URINE CULTURE
MICRO NUMBER: 90086341
SPECIMEN QUALITY: ADEQUATE

## 2017-10-11 ENCOUNTER — Other Ambulatory Visit: Payer: Self-pay

## 2017-10-11 MED ORDER — FLUTICASONE PROPIONATE 50 MCG/ACT NA SUSP: 2.0000 | Freq: Every day | NASAL | 2 refills | Status: DC

## 2017-10-11 MED ORDER — LORATADINE 10 MG PO TABS: 10.0000 mg | ORAL_TABLET | Freq: Two times a day (BID) | ORAL | 2 refills | Status: DC

## 2017-10-11 NOTE — Telephone Encounter (Signed)
Refill request for general medication: Flonase 50 MCG, Claritin  Last office visit: 10/07/2017  Last physical exam: 08/20/20018  Follow-up on file. None indicated

## 2017-10-21 ENCOUNTER — Ambulatory Visit (INDEPENDENT_AMBULATORY_CARE_PROVIDER_SITE_OTHER): Payer: BLUE CROSS/BLUE SHIELD

## 2017-10-21 DIAGNOSIS — R319 Hematuria, unspecified: Secondary | ICD-10-CM

## 2017-10-21 LAB — POCT URINALYSIS DIPSTICK
Bilirubin, UA: NEGATIVE
GLUCOSE UA: NEGATIVE
Ketones, UA: NEGATIVE
NITRITE UA: NEGATIVE
Odor: NORMAL
PH UA: 5 (ref 5.0–8.0)
Protein, UA: NEGATIVE
RBC UA: NEGATIVE
SPEC GRAV UA: 1.015 (ref 1.010–1.025)
UROBILINOGEN UA: 0.2 U/dL

## 2017-11-15 ENCOUNTER — Ambulatory Visit: Payer: BLUE CROSS/BLUE SHIELD | Admitting: Family Medicine

## 2017-11-15 ENCOUNTER — Encounter: Payer: Self-pay | Admitting: Family Medicine

## 2017-11-15 VITALS — BP 120/76 | HR 81 | Temp 98.2°F | Resp 16 | Ht 63.0 in | Wt 192.5 lb

## 2017-11-15 DIAGNOSIS — J029 Acute pharyngitis, unspecified: Secondary | ICD-10-CM | POA: Diagnosis not present

## 2017-11-15 DIAGNOSIS — R059 Cough, unspecified: Secondary | ICD-10-CM

## 2017-11-15 DIAGNOSIS — R05 Cough: Secondary | ICD-10-CM

## 2017-11-15 DIAGNOSIS — J069 Acute upper respiratory infection, unspecified: Secondary | ICD-10-CM | POA: Diagnosis not present

## 2017-11-15 MED ORDER — MAGIC MOUTHWASH: 5.0000 mL | Freq: Three times a day (TID) | ORAL | 0 refills | Status: DC | PRN

## 2017-11-15 MED ORDER — BENZONATATE 100 MG PO CAPS: 100.0000 mg | ORAL_CAPSULE | Freq: Three times a day (TID) | ORAL | 0 refills | Status: DC | PRN

## 2017-11-15 NOTE — Patient Instructions (Signed)
Upper Respiratory Infection, Adult Most upper respiratory infections (URIs) are caused by a virus. A URI affects the nose, throat, and upper air passages. The most common type of URI is often called "the common cold." Follow these instructions at home:  Take medicines only as told by your doctor.  Gargle warm saltwater or take cough drops to comfort your throat as told by your doctor.  Use a warm mist humidifier or inhale steam from a shower to increase air moisture. This may make it easier to breathe.  Drink enough fluid to keep your pee (urine) clear or pale yellow.  Eat soups and other clear broths.  Have a healthy diet.  Rest as needed.  Go back to work when your fever is gone or your doctor says it is okay. ? You may need to stay home longer to avoid giving your URI to others. ? You can also wear a face mask and wash your hands often to prevent spread of the virus.  Use your inhaler more if you have asthma.  Do not use any tobacco products, including cigarettes, chewing tobacco, or electronic cigarettes. If you need help quitting, ask your doctor. Contact a doctor if:  You are getting worse, not better.  Your symptoms are not helped by medicine.  You have chills.  You are getting more short of breath.  You have brown or red mucus.  You have yellow or brown discharge from your nose.  You have pain in your face, especially when you bend forward.  You have a fever.  You have puffy (swollen) neck glands.  You have pain while swallowing.  You have white areas in the back of your throat. Get help right away if:  You have very bad or constant: ? Headache. ? Ear pain. ? Pain in your forehead, behind your eyes, and over your cheekbones (sinus pain). ? Chest pain.  You have long-lasting (chronic) lung disease and any of the following: ? Wheezing. ? Long-lasting cough. ? Coughing up blood. ? A change in your usual mucus.  You have a stiff neck.  You have  changes in your: ? Vision. ? Hearing. ? Thinking. ? Mood. This information is not intended to replace advice given to you by your health care provider. Make sure you discuss any questions you have with your health care provider. Document Released: 02/20/2008 Document Revised: 05/06/2016 Document Reviewed: 12/09/2013 Elsevier Interactive Patient Education  2018 [employer]  Cool Mist Vaporizer A cool mist vaporizer is a device that releases a cool mist into the air. If you have a cough or a cold, using a vaporizer may help relieve your symptoms. The mist adds moisture to the air, which may help thin your mucus and make it less sticky. When your mucus is thin and less sticky, it easier for you to breathe and to cough up secretions. Do not use a vaporizer if you are allergic to mold. Follow these instructions at home:  Follow the instructions that come with the vaporizer.  Do not use anything other than distilled water in the vaporizer.  Do not run the vaporizer all of the time. Doing that can cause mold or bacteria to grow in the vaporizer.  Clean the vaporizer after each time that you use it.  Clean and dry the vaporizer well before storing it.  Stop using the vaporizer if your breathing symptoms get worse. This information is not intended to replace advice given to you by your health care provider. Make sure   you discuss any questions you have with your health care provider. Document Released: 05/31/2004 Document Revised: 03/23/2016 Document Reviewed: 12/03/2015 Elsevier Interactive Patient Education  2018 Elsevier Inc.   

## 2017-11-15 NOTE — Progress Notes (Addendum)
Name: Kathy Benjamin   MRN: [id number]    DOB: 1956-03-23   Date:11/15/2017       Progress Note  Subjective  Chief Complaint  Chief Complaint  Patient presents with  . URI    congested and hoarse    HPI  PT presents with 1 day of hoarse voice, sore throat, cough (some phlegm production), nasal congestion. She took claritin BID but it made her drowsy, so she has decreased to once daily.  She is using flonase once daily.  Denies fevers/chills, chest pain, shortness of breath, abdominal pain, NVD, ear pain/pressure, sinus pain/pressure.  Patient Active Problem List   Diagnosis Date Noted  . Leucocytosis 05/10/2017  . Urge incontinence of urine 05/06/2017  . Osteoarthritis of right hip 05/06/2017  . Post herpetic neuralgia 08/15/2015  . Bunion of left foot 05/02/2015  . H/O: hysterectomy 05/02/2015  . Edema leg 04/29/2015  . Obesity (BMI 30.0-34.9) 04/29/2015  . Primary localized osteoarthrosis, lower leg 04/29/2015  . Perennial allergic rhinitis with seasonal variation 04/29/2015  . Borderline diabetes 04/29/2015  . Menopausal symptom 04/29/2015  . Vitamin D deficiency 04/29/2015  . Abnormal electrocardiogram 04/02/2007    Social History   Tobacco Use  . Smoking status: Never Smoker  . Smokeless tobacco: Never Used  Substance Use Topics  . Alcohol use: No    Alcohol/week: 0.0 oz     Current Outpatient Medications:  .  acetaminophen (TYLENOL) 500 MG tablet, Take 1 tablet (500 mg total) by mouth every 6 (six) hours as needed., Disp: 30 tablet, Rfl: 0 .  Biotin 9147810000 MCG TABS, Take 1 tablet by mouth at bedtime., Disp: , Rfl:  .  Cholecalciferol (VITAMIN D) 2000 units CAPS, Take 1 capsule by mouth daily., Disp: , Rfl:  .  diclofenac sodium (VOLTAREN) 1 % GEL, Apply 2 g topically 3 (three) times daily as needed., Disp: 100 g, Rfl: 1 .  fluticasone (FLONASE) 50 MCG/ACT nasal spray, Place 2 sprays into both nostrils daily., Disp: 16 g, Rfl: 2 .  loratadine (CLARITIN) 10 MG  tablet, Take 1 tablet (10 mg total) by mouth 2 (two) times daily. (Patient not taking: Reported on 11/15/2017), Disp: 60 tablet, Rfl: 2  No Known Allergies  ROS  Ten systems reviewed and is negative except as mentioned in HPI  Objective  Vitals:   11/15/17 1406  BP: 120/76  Pulse: 81  Resp: 16  Temp: 98.2 F (36.8 C)  TempSrc: Oral  SpO2: 96%  Weight: 192 lb 8 oz (87.3 kg)  Height: 5\' 3"  (1.6 m)   Body mass index is 34.1 kg/m.  Nursing Note and Vital Signs reviewed.  Physical Exam  Constitutional: Patient appears well-developed and well-nourished. Obese. No distress.  HEENT: head atraumatic, normocephalic, pupils equal and reactive to light, EOM's intact, TM's without erythema or bulging, no maxillary or frontal sinus tenderness, neck supple without lymphadenopathy, oropharynx moderately erythematous and moist without exudate Cardiovascular: Normal rate, regular rhythm, S1/S2 present.  No murmur or rub heard. No BLE edema. Pulmonary/Chest: Effort normal and breath sounds clear. No respiratory distress or retractions. Psychiatric: Patient has a normal mood and affect. behavior is normal. Judgment and thought content normal.  No results found for this or any previous visit (from the past 72 hour(s)).  Assessment & Plan  1. Upper respiratory tract infection, unspecified type - benzonatate (TESSALON) 100 MG capsule; Take 1 capsule (100 mg total) by mouth 3 (three) times daily as needed for cough.  Dispense: 20 capsule; Refill:  0 - magic mouthwash SOLN; Take 5 mLs by mouth 3 (three) times daily as needed for mouth pain.  Dispense: 200 mL; Refill: 0 - Advised likely viral in nature, continue Claritin and flonase daily.  2. Sore throat - magic mouthwash SOLN; Take 5 mLs by mouth 3 (three) times daily as needed for mouth pain.  Dispense: 200 mL; Refill: 0 - Warm Saltwater gargles and use humidifier at night.  3. Cough - benzonatate (TESSALON) 100 MG capsule; Take 1 capsule (100  mg total) by mouth 3 (three) times daily as needed for cough.  Dispense: 20 capsule; Refill: 0  -Red flags and when to present for emergency care or RTC including fever >101.49F, chest pain, shortness of breath, new/worsening/un-resolving symptoms, reviewed with patient at time of visit. Follow up and care instructions discussed and provided in AVS.   ___________ Addendum - [facility] to fill magic mouthwash due to Nystatin Backorder. I personally spoke with [facility] - they will fill as Benadryl, Hydrocortisone, Maalox, and Lidocaine instead.  Verbal order given via telephone.

## 2017-12-31 DIAGNOSIS — H43313 Vitreous membranes and strands, bilateral: Secondary | ICD-10-CM | POA: Diagnosis not present

## 2018-02-24 ENCOUNTER — Encounter: Payer: Self-pay | Admitting: Family Medicine

## 2018-02-24 ENCOUNTER — Ambulatory Visit: Payer: Self-pay | Admitting: *Deleted

## 2018-02-24 ENCOUNTER — Ambulatory Visit: Payer: BLUE CROSS/BLUE SHIELD | Admitting: Family Medicine

## 2018-02-24 VITALS — BP 116/84 | HR 72 | Temp 98.1°F | Resp 18 | Ht 63.0 in | Wt 197.5 lb

## 2018-02-24 DIAGNOSIS — H9313 Tinnitus, bilateral: Secondary | ICD-10-CM | POA: Diagnosis not present

## 2018-02-24 DIAGNOSIS — R42 Dizziness and giddiness: Secondary | ICD-10-CM

## 2018-02-24 MED ORDER — MECLIZINE HCL 12.5 MG PO TABS: 12.5000 mg | ORAL_TABLET | Freq: Three times a day (TID) | ORAL | 0 refills | Status: DC | PRN

## 2018-02-24 NOTE — Progress Notes (Signed)
Name: Kathy HockeyDorothy Ann Aicher   MRN: [id number]    DOB: 1956-08-03   Date:02/24/2018       Progress Note  Subjective  Chief Complaint  Chief Complaint  Patient presents with  . Dizziness    Onset-couple of days, the room will start spinning if she gets up or makes certain movements. Her dizziness has caused her to have a fall.   . Tinnitus    In bilateral ears, sneezing occasionally, unable to blow her nose like she needs too    HPI  Vertigo and bilateral tinnitus: she states symptoms started about week ago intermittently First episode last week was when she got up, but most nights when she rolls  in bed, but yesterday she was reaching down and fell down on her right knee and scrapped her skin. No hearing loss, denies recent URI. She feels slightly frontal congestion, but no rhinorrhea or congestion at this time. No fever or chills. She had similar symptoms years ago and was given a pill by ENT. Denies any weakness.    Patient Active Problem List   Diagnosis Date Noted  . Leucocytosis 05/10/2017  . Urge incontinence of urine 05/06/2017  . Osteoarthritis of right hip 05/06/2017  . Post herpetic neuralgia 08/15/2015  . Bunion of left foot 05/02/2015  . H/O: hysterectomy 05/02/2015  . Edema leg 04/29/2015  . Obesity (BMI 30.0-34.9) 04/29/2015  . Primary localized osteoarthrosis, lower leg 04/29/2015  . Perennial allergic rhinitis with seasonal variation 04/29/2015  . Borderline diabetes 04/29/2015  . Menopausal symptom 04/29/2015  . Vitamin D deficiency 04/29/2015  . Abnormal electrocardiogram 04/02/2007    Past Surgical History:  Procedure Laterality Date  . ABDOMINAL HYSTERECTOMY    . BREAST EXCISIONAL BIOPSY Right 2009   neg  . BREAST SURGERY Right [id number]   biopsy  . COLONOSCOPY    . COLONOSCOPY WITH PROPOFOL N/A 08/05/2015   Procedure: COLONOSCOPY WITH PROPOFOL;  Surgeon: Midge Miniumarren Wohl, MD;  [facility];  Service: Endoscopy;  Laterality: N/A;  . HERNIA  REPAIR     umbilical    Family History  Problem Relation Age of Onset  . Diabetes Mother   . Hypertension Mother   . Stroke Father   . Hypertension Father   . Cancer Father   . Hyperthyroidism Sister   . Cancer Brother        prostate  . Hyperthyroidism Brother     Social History   Socioeconomic History  . Marital status: Single    Spouse name: Not on file  . Number of children: Not on file  . Years of education: Not on file  . Highest education level: Not on file  Occupational History  . Not on file  Social Needs  . Financial resource strain: Not on file  . Food insecurity:    Worry: Not on file    Inability: Not on file  . Transportation needs:    Medical: Not on file    Non-medical: Not on file  Tobacco Use  . Smoking status: Never Smoker  . Smokeless tobacco: Never Used  Substance and Sexual Activity  . Alcohol use: No    Alcohol/week: 0.0 oz  . Drug use: No  . Sexual activity: Not Currently  Lifestyle  . Physical activity:    Days per week: Not on file    Minutes per session: Not on file  . Stress: Not on file  Relationships  . Social connections:    Talks on phone: Not on  file    Gets together: Not on file    Attends religious service: Not on file    Active member of club or organization: Not on file    Attends meetings of clubs or organizations: Not on file    Relationship status: Not on file  . Intimate partner violence:    Fear of current or ex partner: Not on file    Emotionally abused: Not on file    Physically abused: Not on file    Forced sexual activity: Not on file  Other Topics Concern  . Not on file  Social History Narrative  . Not on file     Current Outpatient Medications:  .  acetaminophen (TYLENOL) 500 MG tablet, Take 1 tablet (500 mg total) by mouth every 6 (six) hours as needed., Disp: 30 tablet, Rfl: 0 .  Biotin 16109 MCG TABS, Take 1 tablet by mouth at bedtime., Disp: , Rfl:  .  Cholecalciferol (VITAMIN D) 2000 units  CAPS, Take 1 capsule by mouth daily., Disp: , Rfl:  .  fluticasone (FLONASE) 50 MCG/ACT nasal spray, Place 2 sprays into both nostrils daily., Disp: 16 g, Rfl: 2 .  loratadine (CLARITIN) 10 MG tablet, Take 1 tablet (10 mg total) by mouth 2 (two) times daily., Disp: 60 tablet, Rfl: 2 .  diclofenac sodium (VOLTAREN) 1 % GEL, Apply 2 g topically 3 (three) times daily as needed. (Patient not taking: Reported on 02/24/2018), Disp: 100 g, Rfl: 1 .  meclizine (ANTIVERT) 12.5 MG tablet, Take 1 tablet (12.5 mg total) by mouth 3 (three) times daily as needed for dizziness., Disp: 30 tablet, Rfl: 0  No Known Allergies   ROS  Ten systems reviewed and is negative except as mentioned in HPI   Objective  Vitals:   02/24/18 1621  BP: 116/84  Pulse: 72  Resp: 18  Temp: 98.1 F (36.7 C)  TempSrc: Oral  SpO2: 95%  Weight: 197 lb 8 oz (89.6 kg)  Height: 5\' 3"  (1.6 m)    Body mass index is 34.99 kg/m.  Physical Exam  Constitutional: Patient appears well-developed and well-nourished. Obese  No distress.  HEENT: head atraumatic, normocephalic, pupils equal and reactive to light, ears normal TM bilaterally,  neck supple, throat within normal limits Cardiovascular: Normal rate, regular rhythm and normal heart sounds.  No murmur heard. No BLE edema. Pulmonary/Chest: Effort normal and breath sounds normal. No respiratory distress. Abdominal: Soft.  There is no tenderness. Psychiatric: Patient has a normal mood and affect. behavior is normal. Judgment and thought content normal. Neurological exam: normal cranial nerves, normal grip, no nystagmus, romberg negative  PHQ2/9: Depression screen [facility] 2/9 02/24/2018 05/06/2017 05/02/2016 09/28/2015 07/20/2015  Decreased Interest 0 0 0 0 0  Down, Depressed, Hopeless 0 0 0 0 0  PHQ - 2 Score 0 0 0 0 0     Fall Risk: Fall Risk  02/24/2018 05/06/2017 09/28/2015 07/20/2015 05/02/2015  Falls in the past year? Yes No No No No  Number falls in past yr: 1 - - - -   Injury with Fall? Yes - - - -  Comment right knee from dizziness - - - -    Functional Status Survey: Is the patient deaf or have difficulty hearing?: No Does the patient have difficulty seeing, even when wearing glasses/contacts?: Yes(prescription glasses) Does the patient have difficulty concentrating, remembering, or making decisions?: No Does the patient have difficulty walking or climbing stairs?: No Does the patient have difficulty dressing or bathing?: No  Does the patient have difficulty doing errands alone such as visiting a doctor's office or shopping?: No    Assessment & Plan  1. Tinnitus, bilateral  - meclizine (ANTIVERT) 12.5 MG tablet; Take 1 tablet (12.5 mg total) by mouth 3 (three) times daily as needed for dizziness.  Dispense: 30 tablet; Refill: 0 - Ambulatory referral to ENT  2. Vertigo  - meclizine (ANTIVERT) 12.5 MG tablet; Take 1 tablet (12.5 mg total) by mouth 3 (three) times daily as needed for dizziness.  Dispense: 30 tablet; Refill: 0 - Ambulatory referral to ENT

## 2018-02-24 NOTE — Patient Instructions (Signed)

## 2018-02-24 NOTE — Telephone Encounter (Signed)
Pt was being transferred to me to triage for dizziness and lightheadedness from [place] agent when the line was disconnected.   (She was having problems with her cell).  I attempted to call her back twice on her mobile number and once on her home number without an answer.    Nelva [place]orma the agent made an appt with Dr. Carlynn PurlSowles for 02/25/18 at 3:40 prior to transferring her to me.

## 2018-02-24 NOTE — Telephone Encounter (Signed)
  I called her back after attempting earlier.   I let her know of her appt today with Dr. Carlynn PurlSowles at 3:40.   She said someone had already called her and let her know. I asked her about her dizziness/lightheadedness that she mentioned to the agent but she replied,  "I'm fine".     She was talking fast and like she needed to get off the phone, was in a hurry.   I again asked if she was ok or feeling dizzy or lightheaded and she replied,   "I'm fine, really".   So I said ok we ended the call.  No advice given.     Reason for Disposition . General information question, no triage required and triager able to answer question  Protocols used: INFORMATION ONLY CALL-A-AH

## 2018-03-05 ENCOUNTER — Other Ambulatory Visit: Payer: Self-pay | Admitting: Family Medicine

## 2018-03-05 NOTE — Telephone Encounter (Signed)
Refill request for general medication. [facility]   Last office visit: 02/24/2018   Follow up on 05/07/18

## 2018-03-06 ENCOUNTER — Other Ambulatory Visit: Payer: Self-pay | Admitting: Family Medicine

## 2018-03-06 MED ORDER — LORATADINE 10 MG PO TABS: 10.0000 mg | ORAL_TABLET | Freq: Two times a day (BID) | ORAL | 2 refills | Status: DC

## 2018-03-06 NOTE — Telephone Encounter (Signed)
Refill request for general medication: Flonase Nasal Spray  Last office visit: 02/24/2018  Last physical exam: 05/06/2017  Follow-ups on file. 05/07/2018

## 2018-03-12 DIAGNOSIS — J301 Allergic rhinitis due to pollen: Secondary | ICD-10-CM | POA: Diagnosis not present

## 2018-03-12 DIAGNOSIS — H8111 Benign paroxysmal vertigo, right ear: Secondary | ICD-10-CM | POA: Diagnosis not present

## 2018-03-19 DIAGNOSIS — H8111 Benign paroxysmal vertigo, right ear: Secondary | ICD-10-CM | POA: Diagnosis not present

## 2018-05-07 ENCOUNTER — Ambulatory Visit (INDEPENDENT_AMBULATORY_CARE_PROVIDER_SITE_OTHER): Payer: BLUE CROSS/BLUE SHIELD | Admitting: Family Medicine

## 2018-05-07 ENCOUNTER — Encounter: Payer: Self-pay | Admitting: Family Medicine

## 2018-05-07 VITALS — BP 122/82 | HR 60 | Temp 97.7°F | Resp 16 | Ht 62.99 in | Wt 196.6 lb

## 2018-05-07 DIAGNOSIS — D72819 Decreased white blood cell count, unspecified: Secondary | ICD-10-CM

## 2018-05-07 DIAGNOSIS — Z23 Encounter for immunization: Secondary | ICD-10-CM | POA: Diagnosis not present

## 2018-05-07 DIAGNOSIS — E559 Vitamin D deficiency, unspecified: Secondary | ICD-10-CM

## 2018-05-07 DIAGNOSIS — Z1231 Encounter for screening mammogram for malignant neoplasm of breast: Secondary | ICD-10-CM | POA: Diagnosis not present

## 2018-05-07 DIAGNOSIS — Z01419 Encounter for gynecological examination (general) (routine) without abnormal findings: Secondary | ICD-10-CM

## 2018-05-07 DIAGNOSIS — Z13220 Encounter for screening for lipoid disorders: Secondary | ICD-10-CM

## 2018-05-07 DIAGNOSIS — Z1239 Encounter for other screening for malignant neoplasm of breast: Secondary | ICD-10-CM

## 2018-05-07 DIAGNOSIS — R001 Bradycardia, unspecified: Secondary | ICD-10-CM

## 2018-05-07 DIAGNOSIS — E2839 Other primary ovarian failure: Secondary | ICD-10-CM

## 2018-05-07 DIAGNOSIS — R7303 Prediabetes: Secondary | ICD-10-CM | POA: Diagnosis not present

## 2018-05-07 NOTE — Progress Notes (Signed)
Name: Kathy Benjamin   MRN: [id number]    DOB: [birth year]   Date:05/07/2018       Progress Note  Subjective  Chief Complaint  Chief Complaint  Patient presents with  . Annual Exam    HPI   Patient presents for annual CPE   Diet: discussed healthy diet  Exercise: needs 150 minutes per week.   USPSTF grade A and B recommendations   Depression:  Depression screen [facility] 2/9 05/07/2018 02/24/2018 05/06/2017 05/02/2016 09/28/2015  Decreased Interest 0 0 0 0 0  Down, Depressed, Hopeless 0 0 0 0 0  PHQ - 2 Score 0 0 0 0 0   Hypertension: BP Readings from Last 3 Encounters:  05/07/18 122/82  02/24/18 116/84  11/15/17 120/76   Obesity: Wt Readings from Last 3 Encounters:  05/07/18 196 lb 9.6 oz (89.2 kg)  02/24/18 197 lb 8 oz (89.6 kg)  11/15/17 192 lb 8 oz (87.3 kg)   BMI Readings from Last 3 Encounters:  05/07/18 34.83 kg/m  02/24/18 34.99 kg/m  11/15/17 34.10 kg/m    Hep C Screening: up to date  STD testing and prevention (HIV/chl/gon/syphilis): N/A Intimate partner violence: negative screen Sexual History/Pain during Intercourse: not sexually active for many years  Menstrual History/LMP/Abnormal Bleeding: s/p hysterectomy  Incontinence Symptoms: she has noticed a little urinary urgency   Advanced Care Planning: A voluntary discussion about advance care planning including the explanation and discussion of advance directives.  Discussed health care proxy and Living will, and the patient was able to identify a health care proxy as sister Kathy Benjamin.  Patient does not have a living will at present time.   Breast cancer:  HM Mammogram  Date Value Ref Range Status  06/11/2014 normal  Final    BRCA gene screening: N/A Cervical cancer screening: N/A  Osteoporosis Screening:  HM Dexa Scan  Date Value Ref Range Status  03/17/2010 1.0-0.4  Final    Lipids:  Lab Results  Component Value Date   CHOL 166 05/06/2017   CHOL 167 05/02/2016   CHOL 183 05/02/2015    Lab Results  Component Value Date   HDL 56 05/06/2017   HDL 65 05/02/2016   HDL 64 05/02/2015   Lab Results  Component Value Date   LDLCALC 96 05/06/2017   LDLCALC 91 05/02/2016   LDLCALC 102 (H) 05/02/2015   Lab Results  Component Value Date   TRIG 72 05/06/2017   TRIG 54 05/02/2016   TRIG 85 05/02/2015   Lab Results  Component Value Date   CHOLHDL 3.0 05/06/2017   CHOLHDL 2.6 05/02/2016   CHOLHDL 2.9 05/02/2015   No results found for: LDLDIRECT  Glucose:  Glucose  Date Value Ref Range Status  05/02/2015 90 65 - 99 mg/dL Final   Glucose, Bld  Date Value Ref Range Status  05/06/2017 84 65 - 99 mg/dL Final  05/02/2016 76 65 - 99 mg/dL Final    Skin cancer: discussed atypical lesions  Colorectal cancer: up to date, repeat 2026 Lung cancer:  Low Dose CT Chest recommended if Age 50-80 years, 30 pack-year currently smoking OR have quit w/in 15years. Patient does not qualify.   ECG: today   Patient Active Problem List   Diagnosis Date Noted  . Leucocytosis 05/10/2017  . Urge incontinence of urine 05/06/2017  . Osteoarthritis of right hip 05/06/2017  . Post herpetic neuralgia 08/15/2015  . Bunion of left foot 05/02/2015  . H/O: hysterectomy 05/02/2015  . Edema leg 04/29/2015  .  Obesity (BMI 30.0-34.9) 04/29/2015  . Primary localized osteoarthrosis, lower leg 04/29/2015  . Perennial allergic rhinitis with seasonal variation 04/29/2015  . Borderline diabetes 04/29/2015  . Menopausal symptom 04/29/2015  . Vitamin D deficiency 04/29/2015  . Abnormal electrocardiogram 04/02/2007    Past Surgical History:  Procedure Laterality Date  . ABDOMINAL HYSTERECTOMY    . BREAST EXCISIONAL BIOPSY Right 2009   neg  . BREAST SURGERY Right [id number]   biopsy  . COLONOSCOPY    . COLONOSCOPY WITH PROPOFOL N/A 08/05/2015   Procedure: COLONOSCOPY WITH PROPOFOL;  Surgeon: Lucilla Lame, MD;  Location: [facility];  Service: Endoscopy;  Laterality: N/A;  . HERNIA REPAIR      umbilical    Family History  Problem Relation Age of Onset  . Diabetes Mother   . Hypertension Mother   . Stroke Father   . Hypertension Father   . Cancer Father   . Hyperthyroidism Sister   . Cancer Brother        prostate  . Hyperthyroidism Brother     Social History   Socioeconomic History  . Marital status: Single    Spouse name: Not on file  . Number of children: 0  . Years of education: Not on file  . Highest education level: High school graduate  Occupational History  . Not on file  Social Needs  . Financial resource strain: Not hard at all  . Food insecurity:    Worry: Never true    Inability: Never true  . Transportation needs:    Medical: No    Non-medical: No  Tobacco Use  . Smoking status: Never Smoker  . Smokeless tobacco: Never Used  Substance and Sexual Activity  . Alcohol use: No    Alcohol/week: 0.0 standard drinks  . Drug use: No  . Sexual activity: Not Currently  Lifestyle  . Physical activity:    Days per week: 3 days    Minutes per session: 30 min  . Stress: Only a little  Relationships  . Social connections:    Talks on phone: More than three times a week    Gets together: Once a week    Attends religious service: More than 4 times per year    Active member of club or organization: Yes    Attends meetings of clubs or organizations: More than 4 times per year    Relationship status: Never married  . Intimate partner violence:    Fear of current or ex partner: No    Emotionally abused: No    Physically abused: No    Forced sexual activity: No  Other Topics Concern  . Not on file  Social History Narrative  . Not on file     Current Outpatient Medications:  .  acetaminophen (TYLENOL) 500 MG tablet, Take 1 tablet (500 mg total) by mouth every 6 (six) hours as needed., Disp: 30 tablet, Rfl: 0 .  azelastine (ASTELIN) 0.1 % nasal spray, INSTILL 1 SPRAY INTO EACH NOSTRIL EVERY 12 HOURS AS NEEDED FOR ALLERGIES, Disp: , Rfl: 11 .   Biotin 10000 MCG TABS, Take 1 tablet by mouth at bedtime., Disp: , Rfl:  .  Cholecalciferol (VITAMIN D) 2000 units CAPS, Take 1 capsule by mouth daily., Disp: , Rfl:  .  fluticasone (FLONASE) 50 MCG/ACT nasal spray, SPRAY 2 SPRAYS INTO EACH NOSTRIL EVERY DAY, Disp: 16 g, Rfl: 11 .  loratadine (CLARITIN) 10 MG tablet, Take 1 tablet (10 mg total) by mouth 2 (two) times  daily., Disp: 60 tablet, Rfl: 2 .  meclizine (ANTIVERT) 12.5 MG tablet, Take 1 tablet (12.5 mg total) by mouth 3 (three) times daily as needed for dizziness., Disp: 30 tablet, Rfl: 0 .  diclofenac sodium (VOLTAREN) 1 % GEL, Apply 2 g topically 3 (three) times daily as needed. (Patient not taking: Reported on 05/07/2018), Disp: 100 g, Rfl: 1  No Known Allergies   ROS  Constitutional: Negative for fever or weight change.  Respiratory: Negative for cough and shortness of breath.   Cardiovascular: Negative for chest pain or palpitations.  Gastrointestinal: Negative for abdominal pain, no bowel changes.  Musculoskeletal: Negative for gait problem or joint swelling.  Skin: Negative for rash.  Neurological: Negative for dizziness or headache.  No other specific complaints in a complete review of systems (except as listed in HPI above).  Objective  Vitals:   05/07/18 0831  BP: 122/82  Pulse: 60  Resp: 16  Temp: 97.7 F (36.5 C)  TempSrc: Oral  SpO2: 96%  Weight: 196 lb 9.6 oz (89.2 kg)  Height: 5' 2.99" (1.6 m)    Body mass index is 34.83 kg/m.  Physical Exam  Constitutional: Patient appears well-developed and obese  No distress.  HENT: Head: Normocephalic and atraumatic. Ears: B TMs ok, no erythema or effusion; Nose: Nose normal. Mouth/Throat: Oropharynx is clear and moist. No oropharyngeal exudate.  Eyes: Conjunctivae and EOM are normal. Pupils are equal, round, and reactive to light. No scleral icterus.  Neck: Normal range of motion. Neck supple. No JVD present. No thyromegaly present.  Cardiovascular: Normal rate,  regular rhythm and normal heart sounds.  No murmur heard. Non pitting  BLE edema. Pulmonary/Chest: Effort normal and breath sounds normal. No respiratory distress. Abdominal: Soft. Bowel sounds are normal, no distension. There is no tenderness. no masses Breast: no lumps or masses, no nipple discharge or rashes. Scar on right upper breast from previous biopsy  FEMALE GENITALIA:  External genitalia normal External urethra normal Pelvic not done RECTAL: not done  Musculoskeletal: Normal range of motion, mild right knee effusion, crepitus with extension of both knees. No gross deformities Neurological: he is alert and oriented to person, place, and time. No cranial nerve deficit. Coordination, balance, strength, speech and gait are normal.  Skin: Skin is warm and dry. No rash noted. No erythema. She has skin tags Psychiatric: Patient has a normal mood and affect. behavior is normal. Judgment and thought content normal.   PHQ2/9: Depression screen [facility] 2/9 05/07/2018 02/24/2018 05/06/2017 05/02/2016 09/28/2015  Decreased Interest 0 0 0 0 0  Down, Depressed, Hopeless 0 0 0 0 0  PHQ - 2 Score 0 0 0 0 0     Fall Risk: Fall Risk  05/07/2018 02/24/2018 05/06/2017 09/28/2015 07/20/2015  Falls in the past year? Yes Yes No No No  Number falls in past yr: 1 1 - - -  Comment dizziness - - - -  Injury with Fall? Yes Yes - - -  Comment scratch on right knee right knee from dizziness - - -     Functional Status Survey: Is the patient deaf or have difficulty hearing?: No Does the patient have difficulty seeing, even when wearing glasses/contacts?: Yes(glasses) Does the patient have difficulty concentrating, remembering, or making decisions?: No Does the patient have difficulty walking or climbing stairs?: No Does the patient have difficulty dressing or bathing?: No Does the patient have difficulty doing errands alone such as visiting a doctor's office or shopping?: No   Assessment &  Plan  1. Well  woman exam  - COMPLETE METABOLIC PANEL WITH GFR - EKG   2. Need for Tdap vaccination  - Tdap vaccine greater than or equal to 7yo IM  3. Need for shingles vaccine  - Varicella-zoster vaccine IM  4. Breast cancer screening  - MM DIGITAL SCREENING BILATERAL  5. Leukopenia, unspecified type  - CBC with Differential/Platelet  6. Vitamin D deficiency  She is taking otc supplementation   7. Borderline diabetes  - Hemoglobin A1c - EKG   8. Lipid screening  - Lipid panel  9. Ovarian failure  - DG Bone Density  10. Bradycardia  Asymptomatic, reviewed EKG with patient, discussed referral to cardiologist or watchful waiting and she chose the later. Discussed signs and symptoms or heart attack and also importance of monitoring for dizziness/syncope   -USPSTF grade A and B recommendations reviewed with patient; age-appropriate recommendations, preventive care, screening tests, etc discussed and encouraged; healthy living encouraged; see AVS for patient education given to patient -Discussed importance of 150 minutes of physical activity weekly, eat two servings of fish weekly, eat one serving of tree nuts ( cashews, pistachios, pecans, almonds.[name]) every other day, eat 6 servings of fruit/vegetables daily and drink plenty of water and avoid sweet beverages.

## 2018-05-07 NOTE — Patient Instructions (Signed)
Preventive Care 40-64 Years, Female Preventive care refers to lifestyle choices and visits with your health care provider that can promote health and wellness. What does preventive care include?  A yearly physical exam. This is also called an annual well check.  Dental exams once or twice a year.  Routine eye exams. Ask your health care provider how often you should have your eyes checked.  Personal lifestyle choices, including: ? Daily care of your teeth and gums. ? Regular physical activity. ? Eating a healthy diet. ? Avoiding tobacco and drug use. ? Limiting alcohol use. ? Practicing safe sex. ? Taking low-dose aspirin daily starting at age 58. ? Taking vitamin and mineral supplements as recommended by your health care provider. What happens during an annual well check? The services and screenings done by your health care provider during your annual well check will depend on your age, overall health, lifestyle risk factors, and family history of disease. Counseling Your health care provider may ask you questions about your:  Alcohol use.  Tobacco use.  Drug use.  Emotional well-being.  Home and relationship well-being.  Sexual activity.  Eating habits.  Work and work Statistician.  Method of birth control.  Menstrual cycle.  Pregnancy history.  Screening You may have the following tests or measurements:  Height, weight, and BMI.  Blood pressure.  Lipid and cholesterol levels. These may be checked every 5 years, or more frequently if you are over 81 years old.  Skin check.  Lung cancer screening. You may have this screening every year starting at age 78 if you have a 30-pack-year history of smoking and currently smoke or have quit within the past 15 years.  Fecal occult blood test (FOBT) of the stool. You may have this test every year starting at age 65.  Flexible sigmoidoscopy or colonoscopy. You may have a sigmoidoscopy every 5 years or a colonoscopy  every 10 years starting at age 30.  Hepatitis C blood test.  Hepatitis B blood test.  Sexually transmitted disease (STD) testing.  Diabetes screening. This is done by checking your blood sugar (glucose) after you have not eaten for a while (fasting). You may have this done every 1-3 years.  Mammogram. This may be done every 1-2 years. Talk to your health care provider about when you should start having regular mammograms. This may depend on whether you have a family history of breast cancer.  BRCA-related cancer screening. This may be done if you have a family history of breast, ovarian, tubal, or peritoneal cancers.  Pelvic exam and Pap test. This may be done every 3 years starting at age 80. Starting at age 36, this may be done every 5 years if you have a Pap test in combination with an HPV test.  Bone density scan. This is done to screen for osteoporosis. You may have this scan if you are at high risk for osteoporosis.  Discuss your test results, treatment options, and if necessary, the need for more tests with your health care provider. Vaccines Your health care provider may recommend certain vaccines, such as:  Influenza vaccine. This is recommended every year.  Tetanus, diphtheria, and acellular pertussis (Tdap, Td) vaccine. You may need a Td booster every 10 years.  Varicella vaccine. You may need this if you have not been vaccinated.  Zoster vaccine. You may need this after age 5.  Measles, mumps, and rubella (MMR) vaccine. You may need at least one dose of MMR if you were born in  1957 or later. You may also need a second dose.  Pneumococcal 13-valent conjugate (PCV13) vaccine. You may need this if you have certain conditions and were not previously vaccinated.  Pneumococcal polysaccharide (PPSV23) vaccine. You may need one or two doses if you smoke cigarettes or if you have certain conditions.  Meningococcal vaccine. You may need this if you have certain  conditions.  Hepatitis A vaccine. You may need this if you have certain conditions or if you travel or work in places where you may be exposed to hepatitis A.  Hepatitis B vaccine. You may need this if you have certain conditions or if you travel or work in places where you may be exposed to hepatitis B.  Haemophilus influenzae type b (Hib) vaccine. You may need this if you have certain conditions.  Talk to your health care provider about which screenings and vaccines you need and how often you need them. This information is not intended to replace advice given to you by your health care provider. Make sure you discuss any questions you have with your health care provider. Document Released: 09/30/2015 Document Revised: 05/23/2016 Document Reviewed: 07/05/2015 Elsevier Interactive Patient Education  2018 Elsevier Inc.  

## 2018-05-08 LAB — CBC WITH DIFFERENTIAL/PLATELET
BASOS ABS: 30 {cells}/uL (ref 0–200)
Basophils Relative: 0.9 %
EOS PCT: 2.1 %
Eosinophils Absolute: 69 cells/uL (ref 15–500)
HCT: 38.7 % (ref 35.0–45.0)
HEMOGLOBIN: 12.6 g/dL (ref 11.7–15.5)
Lymphs Abs: 1574 cells/uL (ref 850–3900)
MCH: 29.6 pg (ref 27.0–33.0)
MCHC: 32.6 g/dL (ref 32.0–36.0)
MCV: 90.8 fL (ref 80.0–100.0)
MPV: 9.4 fL (ref 7.5–12.5)
Monocytes Relative: 13.5 %
Neutro Abs: 1181 cells/uL — ABNORMAL LOW (ref 1500–7800)
Neutrophils Relative %: 35.8 %
Platelets: 195 10*3/uL (ref 140–400)
RBC: 4.26 10*6/uL (ref 3.80–5.10)
RDW: 12 % (ref 11.0–15.0)
Total Lymphocyte: 47.7 %
WBC mixed population: 446 cells/uL (ref 200–950)
WBC: 3.3 10*3/uL — ABNORMAL LOW (ref 3.8–10.8)

## 2018-05-08 LAB — COMPLETE METABOLIC PANEL WITH GFR
AG Ratio: 1.4 (calc) (ref 1.0–2.5)
ALT: 8 U/L (ref 6–29)
AST: 14 U/L (ref 10–35)
Albumin: 3.9 g/dL (ref 3.6–5.1)
Alkaline phosphatase (APISO): 33 U/L (ref 33–130)
BILIRUBIN TOTAL: 1 mg/dL (ref 0.2–1.2)
BUN: 19 mg/dL (ref 7–25)
CALCIUM: 8.9 mg/dL (ref 8.6–10.4)
CHLORIDE: 106 mmol/L (ref 98–110)
CO2: 29 mmol/L (ref 20–32)
Creat: 0.76 mg/dL (ref 0.50–0.99)
GFR, EST AFRICAN AMERICAN: 99 mL/min/{1.73_m2} (ref >=60)
GFR, EST NON AFRICAN AMERICAN: 85 mL/min/{1.73_m2} (ref >=60)
Globulin: 2.7 g/dL (calc) (ref 1.9–3.7)
Glucose, Bld: 76 mg/dL (ref 65–99)
Potassium: 3.8 mmol/L (ref 3.5–5.3)
Sodium: 143 mmol/L (ref 135–146)
TOTAL PROTEIN: 6.6 g/dL (ref 6.1–8.1)

## 2018-05-08 LAB — LIPID PANEL
CHOL/HDL RATIO: 2.9 (calc) (ref <5.0)
CHOLESTEROL: 173 mg/dL (ref <200)
HDL: 59 mg/dL (ref >50)
LDL Cholesterol (Calc): 101 mg/dL (calc) — ABNORMAL HIGH
Non-HDL Cholesterol (Calc): 114 mg/dL (calc) (ref <130)
Triglycerides: 50 mg/dL (ref <150)

## 2018-05-08 LAB — HEMOGLOBIN A1C
EAG (MMOL/L): 6.3 (calc)
Hgb A1c MFr Bld: 5.6 % of total Hgb (ref <5.7)
Mean Plasma Glucose: 114 (calc)

## 2018-05-15 ENCOUNTER — Telehealth: Payer: Self-pay | Admitting: Family Medicine

## 2018-05-15 DIAGNOSIS — Z1239 Encounter for other screening for malignant neoplasm of breast: Secondary | ICD-10-CM

## 2018-05-15 NOTE — Telephone Encounter (Signed)
Copied from CRM [id number]. Topic: Quick Communication - See Telephone Encounter >> May 15, 2018 10:03 AM Jay SchlichterWeikart, Melissa J wrote: CRM for notification. See Telephone encounter for: 05/15/18.Mammogram needs to be changed to IMG [id number] before I can schedule it.  It needs to be 3D  Thanks

## 2018-06-16 ENCOUNTER — Telehealth: Payer: Self-pay

## 2018-06-16 NOTE — Telephone Encounter (Signed)
Pt. Reports her insurance will only pay for her bone density if it is coded as "preventative." If it is coded "diagnostic" they will not pay. Please let pt. Know how it is coded - she can't afford to pay for it.

## 2018-06-16 NOTE — Telephone Encounter (Signed)
Diagnosis was ovarian failure.  Ask what does it need to be?

## 2018-06-16 NOTE — Telephone Encounter (Signed)
Copied from CRM [id number]. Topic: General - Other >> Jun 16, 2018 11:58 AM Kathy Benjamin wrote: Reason for CRM: Pt states she needs to speak with Dr Carlynn Purl nurse in regards to the bone density test and the way it is coded. Cb# [phone]  I tried to contact this patient back to see how I can be of assistance to her but there was no answer. A message was left for her to give our office a call when she got the chance.

## 2018-06-16 NOTE — Telephone Encounter (Signed)
Copied from CRM [id number]. Topic: Quick Communication - Other Results >> Jun 16, 2018  3:52 PM Kathy Benjamin wrote: Pt is requesting a return call [phone] to discuss the bone density referral. She is trying to see if her insurance is going to cover it. When she called the insurance company they are asking her how it was coded. Her appt is in 2023/07/11

## 2018-06-17 NOTE — Telephone Encounter (Signed)
Spoke with Kathy Benjamin at [facility] and Ovarian Failure would be a diagnostic coding and patient would need to call Insurance to find a code or diagnostic that would cover her Bone Density. Left message on her voicemail to call us back with the information and we would be glad to change it for her.

## 2018-06-18 ENCOUNTER — Other Ambulatory Visit: Payer: BLUE CROSS/BLUE SHIELD

## 2018-06-27 DIAGNOSIS — Z23 Encounter for immunization: Secondary | ICD-10-CM | POA: Diagnosis not present

## 2018-07-01 ENCOUNTER — Ambulatory Visit
Admission: RE | Admit: 2018-07-01 | Discharge: 2018-07-01 | Disposition: A | Discharge status: Home / Self Care | Payer: BLUE CROSS/BLUE SHIELD | Source: Ambulatory Visit | Attending: Family Medicine | Admitting: Family Medicine

## 2018-07-01 DIAGNOSIS — E2839 Other primary ovarian failure: Secondary | ICD-10-CM | POA: Diagnosis not present

## 2018-07-01 DIAGNOSIS — Z1239 Encounter for other screening for malignant neoplasm of breast: Secondary | ICD-10-CM | POA: Insufficient documentation

## 2018-07-01 DIAGNOSIS — M85852 Other specified disorders of bone density and structure, left thigh: Secondary | ICD-10-CM | POA: Diagnosis not present

## 2018-07-01 DIAGNOSIS — Z1231 Encounter for screening mammogram for malignant neoplasm of breast: Secondary | ICD-10-CM | POA: Diagnosis not present

## 2018-07-02 ENCOUNTER — Encounter: Payer: Self-pay | Admitting: Family Medicine

## 2018-07-02 DIAGNOSIS — M8580 Other specified disorders of bone density and structure, unspecified site: Secondary | ICD-10-CM | POA: Insufficient documentation

## 2018-07-02 DIAGNOSIS — Z780 Asymptomatic menopausal state: Secondary | ICD-10-CM | POA: Insufficient documentation

## 2018-07-03 ENCOUNTER — Telehealth: Payer: Self-pay

## 2018-07-03 NOTE — Telephone Encounter (Signed)
Copied from CRM [id number]. Topic: Higher education careers adviser Patient (Clinic Use ONLY) >> Jul 03, 2018  2:03 PM Leafy Ro wrote: Reason for CRM: pt is calling back concerning bone density results  Patient was informed and clinical information was mailed to her home address that would be of help to her.

## 2018-07-07 ENCOUNTER — Ambulatory Visit (INDEPENDENT_AMBULATORY_CARE_PROVIDER_SITE_OTHER): Payer: BLUE CROSS/BLUE SHIELD

## 2018-07-07 ENCOUNTER — Ambulatory Visit: Payer: BLUE CROSS/BLUE SHIELD

## 2018-07-07 DIAGNOSIS — Z23 Encounter for immunization: Secondary | ICD-10-CM

## 2018-08-19 ENCOUNTER — Encounter: Payer: Self-pay | Admitting: Nurse Practitioner

## 2018-08-19 ENCOUNTER — Ambulatory Visit: Payer: BLUE CROSS/BLUE SHIELD | Admitting: Nurse Practitioner

## 2018-08-19 VITALS — BP 100/70 | HR 85 | Temp 98.1°F | Resp 16 | Ht 62.99 in | Wt 200.0 lb

## 2018-08-19 DIAGNOSIS — J3489 Other specified disorders of nose and nasal sinuses: Secondary | ICD-10-CM | POA: Diagnosis not present

## 2018-08-19 DIAGNOSIS — R059 Cough, unspecified: Secondary | ICD-10-CM

## 2018-08-19 DIAGNOSIS — R05 Cough: Secondary | ICD-10-CM

## 2018-08-19 DIAGNOSIS — R0982 Postnasal drip: Secondary | ICD-10-CM

## 2018-08-19 MED ORDER — BENZONATATE 100 MG PO CAPS: 100.0000 mg | ORAL_CAPSULE | Freq: Two times a day (BID) | ORAL | 0 refills | Status: DC | PRN

## 2018-08-19 NOTE — Progress Notes (Signed)
Name: Kathy Benjamin   MRN: [id number]    DOB: [birth year]   Date:08/19/2018       Progress Note  Subjective  Chief Complaint  Chief Complaint  Patient presents with  . Sinusitis    drainage coming from her sinus that causes a cough x 5 days. Taking Mucinex.    HPI  On thanksgiving day had rhinorrhea and noted the past few days has had post nasal drip that has been running down throat when she lays down. States now she has cough- getting phlegm- white color. States overall feels better.   Denies shortness of breath, ear aches, sore throat, fevers, chills.   Patient Active Problem List   Diagnosis Date Noted  . Osteopenia 07/02/2018  . Bradycardia 05/07/2018  . Leucocytosis 05/10/2017  . Urge incontinence of urine 05/06/2017  . Osteoarthritis of right hip 05/06/2017  . Post herpetic neuralgia 08/15/2015  . Bunion of left foot 05/02/2015  . H/O: hysterectomy 05/02/2015  . Edema leg 04/29/2015  . Obesity (BMI 30.0-34.9) 04/29/2015  . Primary localized osteoarthrosis, lower leg 04/29/2015  . Perennial allergic rhinitis with seasonal variation 04/29/2015  . Borderline diabetes 04/29/2015  . Menopausal symptom 04/29/2015  . Vitamin D deficiency 04/29/2015  . Abnormal electrocardiogram 04/02/2007    Past Medical History:  Diagnosis Date  . Allergy   . Edema   . Environmental allergies   . Insomnia   . Menopausal and perimenopausal disorder   . Obesity   . Osteoporosis   . Pre-diabetes   . Shingles 07/14/15   right ear and neck.  Completed valtrex. scabbing over  . Tendonitis   . Vitamin A deficiency     Past Surgical History:  Procedure Laterality Date  . ABDOMINAL HYSTERECTOMY    . BREAST EXCISIONAL BIOPSY Right 2009   neg  . BREAST SURGERY Right [id number]   biopsy  . COLONOSCOPY    . COLONOSCOPY WITH PROPOFOL N/A 08/05/2015   Procedure: COLONOSCOPY WITH PROPOFOL;  Surgeon: Midge Minium, MD;  Location[facility];  Service: Endoscopy;  Laterality:  N/A;  . HERNIA REPAIR     umbilical    Social History   Tobacco Use  . Smoking status: Never Smoker  . Smokeless tobacco: Never Used  Substance Use Topics  . Alcohol use: No    Alcohol/week: 0.0 standard drinks     Current Outpatient Medications:  .  acetaminophen (TYLENOL) 500 MG tablet, Take 1 tablet (500 mg total) by mouth every 6 (six) hours as needed., Disp: 30 tablet, Rfl: 0 .  azelastine (ASTELIN) 0.1 % nasal spray, INSTILL 1 SPRAY INTO EACH NOSTRIL EVERY 12 HOURS AS NEEDED FOR ALLERGIES, Disp: , Rfl: 11 .  Biotin 84696 MCG TABS, Take 1 tablet by mouth at bedtime., Disp: , Rfl:  .  Cholecalciferol (VITAMIN D) 2000 units CAPS, Take 1 capsule by mouth daily., Disp: , Rfl:  .  diclofenac sodium (VOLTAREN) 1 % GEL, Apply 2 g topically 3 (three) times daily as needed., Disp: 100 g, Rfl: 1 .  fluticasone (FLONASE) 50 MCG/ACT nasal spray, SPRAY 2 SPRAYS INTO EACH NOSTRIL EVERY DAY, Disp: 16 g, Rfl: 11 .  loratadine (CLARITIN) 10 MG tablet, Take 1 tablet (10 mg total) by mouth 2 (two) times daily., Disp: 60 tablet, Rfl: 2 .  meclizine (ANTIVERT) 12.5 MG tablet, Take 1 tablet (12.5 mg total) by mouth 3 (three) times daily as needed for dizziness., Disp: 30 tablet, Rfl: 0  No Known Allergies  ROS  No other specific complaints in a complete review of systems (except as listed in HPI above).  Objective  Vitals:   08/19/18 1425  BP: 100/70  Pulse: 85  Resp: 16  Temp: 98.1 F (36.7 C)  TempSrc: Oral  SpO2: 97%  Weight: 200 lb (90.7 kg)  Height: 5' 2.99" (1.6 m)    Body mass index is 35.44 kg/m.  Nursing Note and Vital Signs reviewed.  Physical Exam  HENT:  Head: Normocephalic and atraumatic.  Right Ear: Hearing, tympanic membrane, external ear and ear canal normal.  Left Ear: Hearing, tympanic membrane, external ear and ear canal normal.  Nose: Rhinorrhea present. Right sinus exhibits no maxillary sinus tenderness and no frontal sinus tenderness. Left sinus  exhibits no maxillary sinus tenderness and no frontal sinus tenderness.  Mouth/Throat: Uvula is midline and oropharynx is clear and moist. No oropharyngeal exudate, posterior oropharyngeal edema or posterior oropharyngeal erythema.  Eyes: Conjunctivae are normal. Right eye exhibits no discharge. Left eye exhibits no discharge.  Neck: Normal range of motion.  Cardiovascular: Normal rate and regular rhythm.  Pulmonary/Chest: Effort normal and breath sounds normal.  Abdominal: Soft. There is no tenderness.  Lymphadenopathy:    She has no cervical adenopathy.  Neurological: She is alert.  Skin: Skin is warm and dry. No rash noted.  Psychiatric: Judgment normal.    No results found for this or any previous visit (from the past 48 hour(s)).  Assessment & Plan  1. PND (post-nasal drip) Loratadine, flonase   2. Cough - benzonatate (TESSALON) 100 MG capsule; Take 1 capsule (100 mg total) by mouth 2 (two) times daily as needed for cough.  Dispense: 30 capsule; Refill: 0  3. Sinus pressure Loratadine, flonase

## 2018-08-19 NOTE — Patient Instructions (Addendum)
-   Continue to takae musinex DM - Take cough medicine prescribed as needed - Rest, drink plenty of water, avoid sick contacts - Eat nutritious foods and get plenty of vitamin C in diet   You likely have a viral upper respiratory infection (URI). Antibiotics will not reduce the number of days you are ill or prevent you from getting bacterial rhinosinusitis. A URI can take up to 14 days to resolve, but typically last between 7-11 days. Your body is so smart and strong that it will be fighting this illness off for you but it is important that you drink plenty of fluids, rest. Cover your nose/mouth when you cough or sneeze and wash your hands well and often. Here are some helpful things you can use or pick up over the counter from the pharmacy to help with your symptoms:   For Fever/Pain: Acetaminophen every 6 hours as needed (maximum of 3000mg  a day). If you are still uncomfortable you can add ibuprofen OR naproxen  For coughing: try dextromethorphan for a cough suppressant, and/or a cool mist humidifier, lozenges  For sore throat: saline gargles, honey herbal tea, lozenges, throat spray  To dry out your nose: try an antihistamine like loratadine (non-sedating) or diphenhydramine (sedating) or others To relieve a stuffy nose: try an oral decongestant  Like pseudoephedrine if you are under the age of 60 and do not have high blood pressure, neti pot To make blowing your nose easier: guaifenesin

## 2018-12-05 DIAGNOSIS — H8111 Benign paroxysmal vertigo, right ear: Secondary | ICD-10-CM | POA: Diagnosis not present

## 2018-12-31 ENCOUNTER — Other Ambulatory Visit: Payer: Self-pay

## 2018-12-31 ENCOUNTER — Encounter: Payer: Self-pay | Admitting: Family Medicine

## 2018-12-31 ENCOUNTER — Ambulatory Visit: Payer: BLUE CROSS/BLUE SHIELD | Admitting: Family Medicine

## 2018-12-31 VITALS — BP 140/100 | HR 77 | Temp 97.7°F | Resp 16 | Ht 62.99 in | Wt 206.1 lb

## 2018-12-31 DIAGNOSIS — M8580 Other specified disorders of bone density and structure, unspecified site: Secondary | ICD-10-CM | POA: Diagnosis not present

## 2018-12-31 DIAGNOSIS — R600 Localized edema: Secondary | ICD-10-CM

## 2018-12-31 DIAGNOSIS — R7303 Prediabetes: Secondary | ICD-10-CM | POA: Diagnosis not present

## 2018-12-31 DIAGNOSIS — W57XXXA Bitten or stung by nonvenomous insect and other nonvenomous arthropods, initial encounter: Secondary | ICD-10-CM

## 2018-12-31 DIAGNOSIS — D72819 Decreased white blood cell count, unspecified: Secondary | ICD-10-CM | POA: Diagnosis not present

## 2018-12-31 DIAGNOSIS — E559 Vitamin D deficiency, unspecified: Secondary | ICD-10-CM

## 2018-12-31 DIAGNOSIS — R030 Elevated blood-pressure reading, without diagnosis of hypertension: Secondary | ICD-10-CM

## 2018-12-31 DIAGNOSIS — E538 Deficiency of other specified B group vitamins: Secondary | ICD-10-CM

## 2018-12-31 DIAGNOSIS — D72829 Elevated white blood cell count, unspecified: Secondary | ICD-10-CM | POA: Diagnosis not present

## 2018-12-31 MED ORDER — DOXYCYCLINE HYCLATE 100 MG PO TABS: 100.0000 mg | ORAL_TABLET | Freq: Two times a day (BID) | ORAL | 0 refills | Status: DC

## 2018-12-31 NOTE — Progress Notes (Signed)
Name: Kathy Benjamin   MRN: [id number]    DOB: 06-26-1956   Date:12/31/2018       Progress Note  Subjective  Chief Complaint  Chief Complaint  Patient presents with  . Insect Bite    Noticed she had a tick on her left side Friday and pulled it off. Put some alcohol on the bite and Cortizone cream     HPI  Tick bite: she washed her car and three days later noticed a bump on her left flank, she removed it and it was a tick, area is slightly red, but not painful, no rash, headaches  or joint pains but is concerned about Lyme disease.  Elevated BP: she lost her job because of COVID-19, she has been at home for two weeks now and has been eating more than usual, gained a lot of weight, noticing lower extremity edema. Going to bed late. She does not have a history of HTN but bp was high twice during her visit today. Discussed DASH diet and importance of daily walks. She will return in one week for bp check only and if needed we will start her on medication  Leucopenia: we will recheck labs today  Pre-diabetes: she denies polyuria or polydipsia, but has polyphagia. She thinks it is because she is bored. We will recheck labs.  Patient Active Problem List   Diagnosis Date Noted  . Osteopenia 07/02/2018  . Bradycardia 05/07/2018  . Leucocytosis 05/10/2017  . Urge incontinence of urine 05/06/2017  . Osteoarthritis of right hip 05/06/2017  . Post herpetic neuralgia 08/15/2015  . Bunion of left foot 05/02/2015  . H/O: hysterectomy 05/02/2015  . Edema leg 04/29/2015  . Obesity (BMI 30.0-34.9) 04/29/2015  . Primary localized osteoarthrosis, lower leg 04/29/2015  . Perennial allergic rhinitis with seasonal variation 04/29/2015  . Borderline diabetes 04/29/2015  . Menopausal symptom 04/29/2015  . Vitamin D deficiency 04/29/2015  . Abnormal electrocardiogram 04/02/2007    Past Surgical History:  Procedure Laterality Date  . ABDOMINAL HYSTERECTOMY    . BREAST EXCISIONAL BIOPSY Right 2009    neg  . BREAST SURGERY Right [id number]   biopsy  . COLONOSCOPY    . COLONOSCOPY WITH PROPOFOL N/A 08/05/2015   Procedure: COLONOSCOPY WITH PROPOFOL;  Surgeon: Midge Minium, MD;  Location[facility];  Service: Endoscopy;  Laterality: N/A;  . HERNIA REPAIR     umbilical    Family History  Problem Relation Age of Onset  . Diabetes Mother   . Hypertension Mother   . Stroke Father   . Hypertension Father   . Cancer Father   . Hyperthyroidism Sister   . Cancer Brother        prostate  . Hyperthyroidism Brother     Social History   Socioeconomic History  . Marital status: Single    Spouse name: Not on file  . Number of children: 0  . Years of education: Not on file  . Highest education level: High school graduate  Occupational History  . Not on file  Social Needs  . Financial resource strain: Not hard at all  . Food insecurity:    Worry: Never true    Inability: Never true  . Transportation needs:    Medical: No    Non-medical: No  Tobacco Use  . Smoking status: Never Smoker  . Smokeless tobacco: Never Used  Substance and Sexual Activity  . Alcohol use: No    Alcohol/week: 0.0 standard drinks  . Drug use: No  .  Sexual activity: Not Currently  Lifestyle  . Physical activity:    Days per week: 3 days    Minutes per session: 30 min  . Stress: Only a little  Relationships  . Social connections:    Talks on phone: More than three times a week    Gets together: Once a week    Attends religious service: More than 4 times per year    Active member of club or organization: Yes    Attends meetings of clubs or organizations: More than 4 times per year    Relationship status: Never married  . Intimate partner violence:    Fear of current or ex partner: No    Emotionally abused: No    Physically abused: No    Forced sexual activity: No  Other Topics Concern  . Not on file  Social History Narrative   Not working since March 31 st 2020 because of COVID-19 may  be re-hired after the Summer      Current Outpatient Medications:  .  acetaminophen (TYLENOL) 500 MG tablet, Take 1 tablet (500 mg total) by mouth every 6 (six) hours as needed., Disp: 30 tablet, Rfl: 0 .  Biotin 1610910000 MCG TABS, Take 1 tablet by mouth at bedtime., Disp: , Rfl:  .  Cholecalciferol (VITAMIN D) 2000 units CAPS, Take 1 capsule by mouth daily., Disp: , Rfl:  .  fluticasone (FLONASE) 50 MCG/ACT nasal spray, SPRAY 2 SPRAYS INTO EACH NOSTRIL EVERY DAY, Disp: 16 g, Rfl: 11 .  loratadine (CLARITIN) 10 MG tablet, Take 1 tablet (10 mg total) by mouth 2 (two) times daily., Disp: 60 tablet, Rfl: 2 .  azelastine (ASTELIN) 0.1 % nasal spray, INSTILL 1 SPRAY INTO EACH NOSTRIL EVERY 12 HOURS AS NEEDED FOR ALLERGIES, Disp: , Rfl: 11 .  doxycycline (VIBRA-TABS) 100 MG tablet, Take 1 tablet (100 mg total) by mouth 2 (two) times daily., Disp: 28 tablet, Rfl: 0  No Known Allergies  I personally reviewed active problem list, medication list, allergies, family history, social history with the patient/caregiver today.   ROS  Constitutional: Negative for fever , positive for weight change.  Respiratory: Negative for cough and shortness of breath.   Cardiovascular: Negative for chest pain or palpitations.  Gastrointestinal: Negative for abdominal pain, no bowel changes.  Musculoskeletal: Negative for gait problem or joint swelling.  Skin: Negative for rash.  Neurological: Negative for dizziness or headache.  No other specific complaints in a complete review of systems (except as listed in HPI above).  Objective  Vitals:   12/31/18 1211 12/31/18 1239  BP: (!) 144/86 (!) 140/100  Pulse: 77   Resp: 16   Temp: 97.7 F (36.5 C)   TempSrc: Oral   SpO2: 96%   Weight: 206 lb 1.6 oz (93.5 kg)   Height: 5' 2.99" (1.6 m)     Body mass index is 36.52 kg/m.  Physical Exam  Constitutional: Patient appears well-developed and well-nourished. Obese No distress.  HEENT: head atraumatic,  normocephalic, pupils equal and reactive to light,  neck supple, throat within normal limits Cardiovascular: Normal rate, regular rhythm and normal heart sounds.  No murmur heard. trace of   BLE edema. Pulmonary/Chest: Effort normal and breath sounds normal. No respiratory distress. Abdominal: Soft.  There is no tenderness. Skin: tick completely removed, she brought it with her today, area of tick bite showed mild erythema and induration.  Psychiatric: Patient has a normal mood and affect. behavior is normal. Judgment and thought content normal.  PHQ2/9: Depression screen [facility] 2/9 12/31/2018 05/07/2018 02/24/2018 05/06/2017 05/02/2016  Decreased Interest 0 0 0 0 0  Down, Depressed, Hopeless 0 0 0 0 0  PHQ - 2 Score 0 0 0 0 0  Altered sleeping 1 - - - -  Tired, decreased energy 1 - - - -  Change in appetite 2 - - - -  Feeling bad or failure about yourself  0 - - - -  Trouble concentrating 0 - - - -  Moving slowly or fidgety/restless 0 - - - -  Suicidal thoughts 0 - - - -  PHQ-9 Score 4 - - - -  Difficult doing work/chores Not difficult at all - - - -    phq 9 is negative   Fall Risk: Fall Risk  12/31/2018 08/19/2018 05/07/2018 02/24/2018 05/06/2017  Falls in the past year? - 0 Yes Yes No  Number falls in past yr: 0 - 1 1 -  Comment - - dizziness - -  Injury with Fall? 0 - Yes Yes -  Comment - - scratch on right knee right knee from dizziness -    Functional Status Survey: Is the patient deaf or have difficulty hearing?: No Does the patient have difficulty seeing, even when wearing glasses/contacts?: Yes Does the patient have difficulty concentrating, remembering, or making decisions?: No Does the patient have difficulty walking or climbing stairs?: No Does the patient have difficulty dressing or bathing?: No Does the patient have difficulty doing errands alone such as visiting a doctor's office or shopping?: No   Assessment & Plan   1. Vitamin D deficiency  - VITAMIN D 25 Hydroxy  (Vit-D Deficiency, Fractures)  2. Leukopenia, unspecified type  - CBC with Differential/Platelet  3. Borderline diabetes  - Hemoglobin A1c  4. Bilateral lower extremity edema  - CBC with Differential/Platelet - COMPLETE METABOLIC PANEL WITH GFR  5. B12 deficiency  - B12 and Folate Panel  6. Elevated blood pressure reading  Needs to monitor salt intake, exercise more, and monitor bp at home

## 2019-01-01 LAB — CBC WITH DIFFERENTIAL/PLATELET
Absolute Monocytes: 570 cells/uL (ref 200–950)
Basophils Absolute: 21 cells/uL (ref 0–200)
Basophils Relative: 0.5 %
Eosinophils Absolute: 78 cells/uL (ref 15–500)
Eosinophils Relative: 1.9 %
HCT: 39.2 % (ref 35.0–45.0)
Hemoglobin: 13 g/dL (ref 11.7–15.5)
Lymphs Abs: 1714 cells/uL (ref 850–3900)
MCH: 30.2 pg (ref 27.0–33.0)
MCHC: 33.2 g/dL (ref 32.0–36.0)
MCV: 91.2 fL (ref 80.0–100.0)
MPV: 9.8 fL (ref 7.5–12.5)
Monocytes Relative: 13.9 %
Neutro Abs: 1718 cells/uL (ref 1500–7800)
Neutrophils Relative %: 41.9 %
Platelets: 181 10*3/uL (ref 140–400)
RBC: 4.3 10*6/uL (ref 3.80–5.10)
RDW: 12.2 % (ref 11.0–15.0)
Total Lymphocyte: 41.8 %
WBC: 4.1 10*3/uL (ref 3.8–10.8)

## 2019-01-01 LAB — COMPLETE METABOLIC PANEL WITH GFR
AG Ratio: 1.6 (calc) (ref 1.0–2.5)
ALT: 11 U/L (ref 6–29)
AST: 19 U/L (ref 10–35)
Albumin: 4.1 g/dL (ref 3.6–5.1)
Alkaline phosphatase (APISO): 35 U/L — ABNORMAL LOW (ref 37–153)
BUN: 14 mg/dL (ref 7–25)
CO2: 28 mmol/L (ref 20–32)
Calcium: 9.4 mg/dL (ref 8.6–10.4)
Chloride: 105 mmol/L (ref 98–110)
Creat: 0.71 mg/dL (ref 0.50–0.99)
GFR, Est African American: 107 mL/min/{1.73_m2} (ref >=60)
GFR, Est Non African American: 92 mL/min/{1.73_m2} (ref >=60)
Globulin: 2.5 g/dL (calc) (ref 1.9–3.7)
Glucose, Bld: 83 mg/dL (ref 65–99)
Potassium: 3.8 mmol/L (ref 3.5–5.3)
Sodium: 142 mmol/L (ref 135–146)
Total Bilirubin: 1.3 mg/dL — ABNORMAL HIGH (ref 0.2–1.2)
Total Protein: 6.6 g/dL (ref 6.1–8.1)

## 2019-01-01 LAB — HEMOGLOBIN A1C
Hgb A1c MFr Bld: 5.8 % of total Hgb — ABNORMAL HIGH (ref <5.7)
Mean Plasma Glucose: 120 (calc)
eAG (mmol/L): 6.6 (calc)

## 2019-01-01 LAB — B12 AND FOLATE PANEL
Folate: 15.5 ng/mL
Vitamin B-12: 476 pg/mL (ref 200–1100)

## 2019-01-01 LAB — VITAMIN D 25 HYDROXY (VIT D DEFICIENCY, FRACTURES): Vit D, 25-Hydroxy: 29 ng/mL — ABNORMAL LOW (ref 30–100)

## 2019-01-07 ENCOUNTER — Other Ambulatory Visit: Payer: Self-pay

## 2019-01-07 ENCOUNTER — Ambulatory Visit: Payer: BLUE CROSS/BLUE SHIELD

## 2019-01-07 VITALS — BP 124/72 | HR 70 | Resp 12

## 2019-01-07 DIAGNOSIS — R030 Elevated blood-pressure reading, without diagnosis of hypertension: Secondary | ICD-10-CM

## 2019-01-07 NOTE — Progress Notes (Signed)
Pt is here for BP check. BP today was 124/72 with a HR of 70. Dr. Carlynn Purl notified.

## 2019-01-12 ENCOUNTER — Ambulatory Visit: Payer: Self-pay

## 2019-01-12 NOTE — Telephone Encounter (Signed)
Patient states she done well with the other 21 pills of the Doxycyline and just didn't know if it was because she ate fish with the antibiotic that she had that reaction or what. She stated Saturday she had trouble swallowing and sat up all night. She is doing ok now but just needs to know does she need to continue the next 6 pills?

## 2019-01-12 NOTE — Telephone Encounter (Signed)
Pt called c/o having trouble swallowing Saturday night. Pt stated that she was asymptomatic Sunday morning. Pt stated that she thinks it was the Doxycycline she was taking after a tick bite. Pt stated that she has not taken any further doses since Saturday and would like to know what to do with the remaining 6 pills of doxycycline. Pt asking for a call back.       Reason for Disposition . Caller has NON-URGENT medication question about med that PCP prescribed and triager unable to answer question  Answer Assessment - Initial Assessment Questions 1. SYMPTOMS: "Do you have any symptoms?"     Pt couldn't swallow Saturday night went to sleep then in the morning she was not having the symptoms and has been ok ever since 2. SEVERITY: If symptoms are present, ask "Are they mild, moderate or severe?"     Not present at time of call  Protocols used: MEDICATION QUESTION CALL-A-AH

## 2019-01-13 NOTE — Telephone Encounter (Signed)
Patient notified and given her lab results.

## 2019-01-23 DIAGNOSIS — R42 Dizziness and giddiness: Secondary | ICD-10-CM | POA: Diagnosis not present

## 2019-01-23 DIAGNOSIS — J301 Allergic rhinitis due to pollen: Secondary | ICD-10-CM | POA: Diagnosis not present

## 2019-01-23 DIAGNOSIS — H6062 Unspecified chronic otitis externa, left ear: Secondary | ICD-10-CM | POA: Diagnosis not present

## 2019-02-10 DIAGNOSIS — H10413 Chronic giant papillary conjunctivitis, bilateral: Secondary | ICD-10-CM | POA: Diagnosis not present

## 2019-02-27 ENCOUNTER — Other Ambulatory Visit: Payer: Self-pay | Admitting: Family Medicine

## 2019-03-06 ENCOUNTER — Other Ambulatory Visit: Payer: Self-pay

## 2019-03-06 ENCOUNTER — Encounter: Payer: Self-pay | Admitting: Family Medicine

## 2019-03-06 ENCOUNTER — Ambulatory Visit (INDEPENDENT_AMBULATORY_CARE_PROVIDER_SITE_OTHER): Payer: BLUE CROSS/BLUE SHIELD | Admitting: Family Medicine

## 2019-03-06 VITALS — BP 110/80 | HR 76 | Temp 98.1°F | Resp 16 | Ht 63.0 in | Wt 203.0 lb

## 2019-03-06 DIAGNOSIS — M1712 Unilateral primary osteoarthritis, left knee: Secondary | ICD-10-CM

## 2019-03-06 DIAGNOSIS — M25462 Effusion, left knee: Secondary | ICD-10-CM

## 2019-03-06 DIAGNOSIS — R7303 Prediabetes: Secondary | ICD-10-CM

## 2019-03-06 MED ORDER — DICLOFENAC SODIUM 1 % TD GEL: 4.0000 g | Freq: Four times a day (QID) | TRANSDERMAL | 2 refills | Status: DC

## 2019-03-06 MED ORDER — ACETAMINOPHEN 500 MG PO TABS: 500.0000 mg | ORAL_TABLET | Freq: Four times a day (QID) | ORAL | 0 refills | Status: DC | PRN

## 2019-03-06 NOTE — Progress Notes (Signed)
Name: Kathy Benjamin   MRN: [id number]    DOB: 1955-10-02   Date:03/06/2019       Progress Note  Subjective  Chief Complaint  Chief Complaint  Patient presents with  . Leg Swelling    knee swelling    HPI  Pre-diabetes: she denies polyuria or polydipsia, but has polyphagia. Discussed last A1C.  Leucopenia: resolved  Left knee pain: going on for months but worse over the past week, she went back to work this week after being off work for 10 weeks. She has noticed anterior knee pain is worse. She had to take an Aleve last night. She states pain is throbbing and sometimes is sharp. Worse when she walks or stands. Kathy Benjamin is better with rest. No redness or increase in warmth. Discussed steroid injection , synvisc , X-ray, she would prefer taking otc medication and try topical medication since she has a high deductible and will think about referral to Ortho   Patient Active Problem List   Diagnosis Date Noted  . Osteopenia 07/02/2018  . Bradycardia 05/07/2018  . Leucocytosis 05/10/2017  . Urge incontinence of urine 05/06/2017  . Osteoarthritis of right hip 05/06/2017  . Post herpetic neuralgia 08/15/2015  . Bunion of left foot 05/02/2015  . H/O: hysterectomy 05/02/2015  . Edema leg 04/29/2015  . Obesity (BMI 30.0-34.9) 04/29/2015  . Primary localized osteoarthrosis, lower leg 04/29/2015  . Perennial allergic rhinitis with seasonal variation 04/29/2015  . Borderline diabetes 04/29/2015  . Menopausal symptom 04/29/2015  . Vitamin D deficiency 04/29/2015  . Abnormal electrocardiogram 04/02/2007    Past Surgical History:  Procedure Laterality Date  . ABDOMINAL HYSTERECTOMY    . BREAST EXCISIONAL BIOPSY Right 2009   neg  . BREAST SURGERY Right [id number]   biopsy  . COLONOSCOPY    . COLONOSCOPY WITH PROPOFOL N/A 08/05/2015   Procedure: COLONOSCOPY WITH PROPOFOL;  Surgeon: Midge Miniumarren Wohl, MD;  [facility];  Service: Endoscopy;  Laterality: N/A;  . HERNIA REPAIR      umbilical    Family History  Problem Relation Age of Onset  . Diabetes Mother   . Hypertension Mother   . Stroke Father   . Hypertension Father   . Cancer Father   . Hyperthyroidism Sister   . Cancer Brother        prostate  . Hyperthyroidism Brother     Social History   Socioeconomic History  . Marital status: Single    Spouse name: Not on file  . Number of children: 0  . Years of education: Not on file  . Highest education level: High school graduate  Occupational History  . Not on file  Social Needs  . Financial resource strain: Not hard at all  . Food insecurity    Worry: Never true    Inability: Never true  . Transportation needs    Medical: No    Non-medical: No  Tobacco Use  . Smoking status: Never Smoker  . Smokeless tobacco: Never Used  Substance and Sexual Activity  . Alcohol use: No    Alcohol/week: 0.0 standard drinks  . Drug use: No  . Sexual activity: Not Currently  Lifestyle  . Physical activity    Days per week: 3 days    Minutes per session: 30 min  . Stress: Only a little  Relationships  . Social connections    Talks on phone: More than three times a week    Gets together: Once a week  Attends religious service: More than 4 times per year    Active member of club or organization: Yes    Attends meetings of clubs or organizations: More than 4 times per year    Relationship status: Never married  . Intimate partner violence    Fear of current or ex partner: No    Emotionally abused: No    Physically abused: No    Forced sexual activity: No  Other Topics Concern  . Not on file  Social History Narrative   Not working since March 31 st 2020 because of COVID-19 may be re-hired after the Summer      Current Outpatient Medications:  .  acetaminophen (TYLENOL) 500 MG tablet, Take 1 tablet (500 mg total) by mouth every 6 (six) hours as needed., Disp: 30 tablet, Rfl: 0 .  azelastine (ASTELIN) 0.1 % nasal spray, INSTILL 1 SPRAY INTO EACH  NOSTRIL EVERY 12 HOURS AS NEEDED FOR ALLERGIES, Disp: , Rfl: 11 .  Biotin 1610910000 MCG TABS, Take 1 tablet by mouth at bedtime., Disp: , Rfl:  .  Cholecalciferol (VITAMIN D) 2000 units CAPS, Take 1 capsule by mouth daily., Disp: , Rfl:  .  fluticasone (FLONASE) 50 MCG/ACT nasal spray, SPRAY 2 SPRAYS INTO EACH NOSTRIL EVERY DAY, Disp: 16 g, Rfl: 11 .  loratadine (CLARITIN) 10 MG tablet, TAKE 1 TABLET BY MOUTH TWICE A DAY, Disp: 60 tablet, Rfl: 1 .  mometasone (ELOCON) 0.1 % lotion, INSTILL 4 DROPS IN EACH EAR CANAL AT BEDTIME AND AS NEEDED FOR DRY SKIN/ITCHING, Disp: , Rfl:  .  prednisoLONE acetate (PRED FORTE) 1 % ophthalmic suspension, INSTILL 1 DROP INTO BOTH EYES 4 TIMES A DAY, Disp: , Rfl:  .  diclofenac sodium (VOLTAREN) 1 % GEL, Apply 4 g topically 4 (four) times daily., Disp: 150 g, Rfl: 2  No Known Allergies  I personally reviewed active problem list, medication list, allergies, family history, social history with the patient/caregiver today.   ROS  Ten systems reviewed and is negative except as mentioned in HPI    Objective  Vitals:   03/06/19 1128  BP: 110/80  Pulse: 76  Resp: 16  Temp: 98.1 F (36.7 C)  TempSrc: Oral  SpO2: 99%  Weight: 203 lb (92.1 kg)  Height: 5\' 3"  (1.6 m)    Body mass index is 35.96 kg/m.  Physical Exam  Constitutional: Patient appears well-developed and well-nourished. Obese  No distress.  HEENT: head atraumatic, normocephalic, pupils equal and reactive to light, neck supple Cardiovascular: Normal rate, regular rhythm and normal heart sounds.  No murmur heard. No BLE edema. Pulmonary/Chest: Effort normal and breath sounds normal. No respiratory distress. Abdominal: Soft.  There is no tenderness. Muscular Skeletal: mild effusion of left knee, no erythema or increase in warmth, crepitus with extension of left knee  Psychiatric: Patient has a normal mood and affect. behavior is normal. Judgment and thought content normal.  Recent Results (from  the past 2160 hour(s))  CBC with Differential/Platelet     Status: None   Collection Time: 12/31/18  1:58 PM  Result Value Ref Range   WBC 4.1 3.8 - 10.8 Thousand/uL   RBC 4.30 3.80 - 5.10 Million/uL   Hemoglobin 13.0 11.7 - 15.5 g/dL   HCT 60.439.2 54.035.0 - 98.145.0 %   MCV 91.2 80.0 - 100.0 fL   MCH 30.2 27.0 - 33.0 pg   MCHC 33.2 32.0 - 36.0 g/dL   RDW 19.112.2 47.811.0 - 29.515.0 %   Platelets 181 140 -  400 Thousand/uL   MPV 9.8 7.5 - 12.5 fL   Neutro Abs 1,718 1,500 - 7,800 cells/uL   Lymphs Abs 1,714 850 - 3,900 cells/uL   Absolute Monocytes 570 200 - 950 cells/uL   Eosinophils Absolute 78 15 - 500 cells/uL   Basophils Absolute 21 0 - 200 cells/uL   Neutrophils Relative % 41.9 %   Total Lymphocyte 41.8 %   Monocytes Relative 13.9 %   Eosinophils Relative 1.9 %   Basophils Relative 0.5 %  COMPLETE METABOLIC PANEL WITH GFR     Status: Abnormal   Collection Time: 12/31/18  1:58 PM  Result Value Ref Range   Glucose, Bld 83 65 - 99 mg/dL    Comment: .            Fasting reference interval .    BUN 14 7 - 25 mg/dL   Creat 1.610.71 0.960.50 - 0.450.99 mg/dL    Comment: For patients >61 years of age, the reference limit for Creatinine is approximately 13% higher for people identified as African-American. .    GFR, Est Non African American 92 > OR = 60 mL/min/1.4273m2   GFR, Est African American 107 > OR = 60 mL/min/1.3173m2   BUN/Creatinine Ratio NOT APPLICABLE 6 - 22 (calc)   Sodium 142 135 - 146 mmol/L   Potassium 3.8 3.5 - 5.3 mmol/L   Chloride 105 98 - 110 mmol/L   CO2 28 20 - 32 mmol/L   Calcium 9.4 8.6 - 10.4 mg/dL   Total Protein 6.6 6.1 - 8.1 g/dL   Albumin 4.1 3.6 - 5.1 g/dL   Globulin 2.5 1.9 - 3.7 g/dL (calc)   AG Ratio 1.6 1.0 - 2.5 (calc)   Total Bilirubin 1.3 (H) 0.2 - 1.2 mg/dL   Alkaline phosphatase (APISO) 35 (L) 37 - 153 U/L   AST 19 10 - 35 U/L   ALT 11 6 - 29 U/L  B12 and Folate Panel     Status: None   Collection Time: 12/31/18  1:58 PM  Result Value Ref Range   Vitamin B-12  476 200 - 1,100 pg/mL   Folate 15.5 ng/mL    Comment:                            Reference Range                            Low:           <3.4                            Borderline:    3.4-5.4                            Normal:        >5.4 .   VITAMIN D 25 Hydroxy (Vit-D Deficiency, Fractures)     Status: Abnormal   Collection Time: 12/31/18  1:58 PM  Result Value Ref Range   Vit D, 25-Hydroxy 29 (L) 30 - 100 ng/mL    Comment: Vitamin D Status         25-OH Vitamin D: . Deficiency:                    <20 ng/mL Insufficiency:  20 - 29 ng/mL Optimal:                 > or = 30 ng/mL . For 25-OH Vitamin D testing on patients on  D2-supplementation and patients for whom quantitation  of D2 and D3 fractions is required, the QuestAssureD(TM) 25-OH VIT D, (D2,D3), LC/MS/MS is recommended: order  code [phone] (patients >26yrs). . For more information on this test, go to: http://education.questdiagnostics.com/faq/FAQ163 (This link is being provided for  informational/educational purposes only.)   Hemoglobin A1c     Status: Abnormal   Collection Time: 12/31/18  1:58 PM  Result Value Ref Range   Hgb A1c MFr Bld 5.8 (H) <5.7 % of total Hgb    Comment: For someone without known diabetes, a hemoglobin  A1c value between 5.7% and 6.4% is consistent with prediabetes and should be confirmed with a  follow-up test. . For someone with known diabetes, a value <7% indicates that their diabetes is well controlled. A1c targets should be individualized based on duration of diabetes, age, comorbid conditions, and other considerations. . This assay result is consistent with an increased risk of diabetes. . Currently, no consensus exists regarding use of hemoglobin A1c for diagnosis of diabetes for children. .    Mean Plasma Glucose 120 (calc)   eAG (mmol/L) 6.6 (calc)      PHQ2/9: Depression screen [facility] 2/9 03/06/2019 12/31/2018 05/07/2018 02/24/2018 05/06/2017  Decreased Interest  0 0 0 0 0  Down, Depressed, Hopeless 0 0 0 0 0  PHQ - 2 Score 0 0 0 0 0  Altered sleeping 0 1 - - -  Tired, decreased energy 0 1 - - -  Change in appetite 0 2 - - -  Feeling bad or failure about yourself  0 0 - - -  Trouble concentrating 0 0 - - -  Moving slowly or fidgety/restless 0 0 - - -  Suicidal thoughts 0 0 - - -  PHQ-9 Score 0 4 - - -  Difficult doing work/chores - Not difficult at all - - -    phq 9 is negative   Fall Risk: Fall Risk  03/06/2019 12/31/2018 08/19/2018 05/07/2018 02/24/2018  Falls in the past year? 0 - 0 Yes Yes  Number falls in past yr: 0 0 - 1 1  Comment - - - dizziness -  Injury with Fall? 0 0 - Yes Yes  Comment - - - scratch on right knee right knee from dizziness     Functional Status Survey: Is the patient deaf or have difficulty hearing?: No Does the patient have difficulty seeing, even when wearing glasses/contacts?: No Does the patient have difficulty concentrating, remembering, or making decisions?: No Does the patient have difficulty walking or climbing stairs?: Yes Does the patient have difficulty dressing or bathing?: No Does the patient have difficulty doing errands alone such as visiting a doctor's office or shopping?: No    Assessment & Plan   1. Primary osteoarthritis of left knee  - diclofenac sodium (VOLTAREN) 1 % GEL; Apply 4 g topically 4 (four) times daily.  Dispense: 150 g; Refill: 2 - acetaminophen (TYLENOL) 500 MG tablet; Take 1 tablet (500 mg total) by mouth every 6 (six) hours as needed.  Dispense: 500 tablet; Refill: 0  2. Effusion of left knee  - acetaminophen (TYLENOL) 500 MG tablet; Take 1 tablet (500 mg total) by mouth every 6 (six) hours as needed.  Dispense: 500 tablet; Refill: 0  3. Borderline diabetes  Discussed  low carbohydrate date

## 2019-04-26 ENCOUNTER — Other Ambulatory Visit: Payer: Self-pay | Admitting: Family Medicine

## 2019-04-26 DIAGNOSIS — M1712 Unilateral primary osteoarthritis, left knee: Secondary | ICD-10-CM

## 2019-04-27 ENCOUNTER — Other Ambulatory Visit: Payer: Self-pay | Admitting: Family Medicine

## 2019-05-11 ENCOUNTER — Ambulatory Visit (INDEPENDENT_AMBULATORY_CARE_PROVIDER_SITE_OTHER): Payer: BC Managed Care – PPO | Admitting: Family Medicine

## 2019-05-11 ENCOUNTER — Encounter: Payer: Self-pay | Admitting: Family Medicine

## 2019-05-11 ENCOUNTER — Other Ambulatory Visit: Payer: Self-pay

## 2019-05-11 VITALS — BP 122/78 | HR 59 | Temp 97.5°F | Resp 14 | Ht 64.0 in | Wt 200.7 lb

## 2019-05-11 DIAGNOSIS — Z1239 Encounter for other screening for malignant neoplasm of breast: Secondary | ICD-10-CM

## 2019-05-11 DIAGNOSIS — Z0000 Encounter for general adult medical examination without abnormal findings: Secondary | ICD-10-CM | POA: Diagnosis not present

## 2019-05-11 DIAGNOSIS — R7303 Prediabetes: Secondary | ICD-10-CM

## 2019-05-11 DIAGNOSIS — E559 Vitamin D deficiency, unspecified: Secondary | ICD-10-CM | POA: Diagnosis not present

## 2019-05-11 DIAGNOSIS — Z13220 Encounter for screening for lipoid disorders: Secondary | ICD-10-CM

## 2019-05-11 DIAGNOSIS — E538 Deficiency of other specified B group vitamins: Secondary | ICD-10-CM

## 2019-05-11 DIAGNOSIS — D72819 Decreased white blood cell count, unspecified: Secondary | ICD-10-CM

## 2019-05-11 DIAGNOSIS — M1712 Unilateral primary osteoarthritis, left knee: Secondary | ICD-10-CM

## 2019-05-11 DIAGNOSIS — L299 Pruritus, unspecified: Secondary | ICD-10-CM | POA: Diagnosis not present

## 2019-05-11 NOTE — Progress Notes (Signed)
Name: Kathy Benjamin   MRN: [id number]    DOB: June 12, 1956   Date:05/11/2019       Progress Note  Subjective  Chief Complaint  Chief Complaint  Patient presents with  . Annual Exam    HPI   Patient presents for annual CPE   Pruritus: she has noticed itching on her trunk and arms going on for the past couple of weeks, sometimes wakes up during the night scratching. She denies pruritis in finger webs but has noticed some pilling of her skin on the finger webs. No fever, chills. No rashes or burrows or bumps, feels like is something crawling under her skin. She has changed soaps, discussed going back to plain [place] and go back to previous detergent  Pre-diabetes: her A1C has gone up from 5.6% to 5.8%, she has been craving sweets, she denies polyphagia, polydipsia or polyuria, discussed importance of changing her diet  OA left knee: using topical voltaren gel and seems to help with the pain, discussed importance of physical activity to improve the pain.    Diet: she has been cooking at home, baking her food, but she has been craving sweets lately  Exercise: not very active   USPSTF grade A and B recommendations    Office Visit from 05/11/2019 in [facility]  AUDIT-C Score  0     Hypertension: BP Readings from Last 3 Encounters:  05/11/19 122/78  03/06/19 110/80  01/07/19 124/72   Obesity: Wt Readings from Last 3 Encounters:  05/11/19 200 lb 11.2 oz (91 kg)  03/06/19 203 lb (92.1 kg)  12/31/18 206 lb 1.6 oz (93.5 kg)   BMI Readings from Last 3 Encounters:  05/11/19 34.45 kg/m  03/06/19 35.96 kg/m  12/31/18 36.52 kg/m    Hep C Screening: done  STD testing and prevention (HIV/chl/gon/syphilis): N/A  Intimate partner violence: negative screen  Sexual History/Pain during Intercourse: not sexually active in many years  Menstrual History/LMP/Abnormal Bleeding: sp hysterectomy  Incontinence Symptoms: if she holds for a long time she needs to rush    Advanced Care Planning: A voluntary discussion about advance care planning including the explanation and discussion of advance directives.  Discussed health care proxy and Living will, and the patient was able to identify a health care proxy as sister - [name].  Patient does not have a living will at present time.  Breast cancer: 06/2018 BRCA gene screening: N/A Cervical cancer screening: N/A, hysterectomy   Osteoporosis Screening: 06/2018 - osteopenia left femur   Lipids:  Lab Results  Component Value Date   CHOL 173 05/07/2018   CHOL 166 05/06/2017   CHOL 167 05/02/2016   Lab Results  Component Value Date   HDL 59 05/07/2018   HDL 56 05/06/2017   HDL 65 05/02/2016   Lab Results  Component Value Date   LDLCALC 101 (H) 05/07/2018   LDLCALC 96 05/06/2017   LDLCALC 91 05/02/2016   Lab Results  Component Value Date   TRIG 50 05/07/2018   TRIG 72 05/06/2017   TRIG 54 05/02/2016   Lab Results  Component Value Date   CHOLHDL 2.9 05/07/2018   CHOLHDL 3.0 05/06/2017   CHOLHDL 2.6 05/02/2016   No results found for: LDLDIRECT  Glucose:  Glucose, Bld  Date Value Ref Range Status  12/31/2018 83 65 - 99 mg/dL Final    Comment:    .            Fasting reference interval .  05/07/2018 76 65 - 99 mg/dL Final    Comment:    .            Fasting reference interval .   05/06/2017 84 65 - 99 mg/dL Final    Skin cancer: discussed atypical lesions  Colorectal cancer: repeat in 2026  Lung cancer:   Low Dose CT Chest recommended if Age 60-80 years, 30 pack-year currently smoking OR have quit w/in 15years. Patient does not qualify.   ECG:04/2018   Patient Active Problem List   Diagnosis Date Noted  . Osteopenia 07/02/2018  . Bradycardia 05/07/2018  . Leucocytosis 05/10/2017  . Urge incontinence of urine 05/06/2017  . Osteoarthritis of right hip 05/06/2017  . Post herpetic neuralgia 08/15/2015  . Bunion of left foot 05/02/2015  . H/O: hysterectomy 05/02/2015   . Edema leg 04/29/2015  . Obesity (BMI 30.0-34.9) 04/29/2015  . Primary localized osteoarthrosis, lower leg 04/29/2015  . Perennial allergic rhinitis with seasonal variation 04/29/2015  . Borderline diabetes 04/29/2015  . Menopausal symptom 04/29/2015  . Vitamin D deficiency 04/29/2015  . Abnormal electrocardiogram 04/02/2007    Past Surgical History:  Procedure Laterality Date  . ABDOMINAL HYSTERECTOMY    . BREAST EXCISIONAL BIOPSY Right 2009   neg  . BREAST SURGERY Right [id number]   biopsy  . COLONOSCOPY    . COLONOSCOPY WITH PROPOFOL N/A 08/05/2015   Procedure: COLONOSCOPY WITH PROPOFOL;  Surgeon: Lucilla Lame, MD;  Location: [place];  Service: Endoscopy;  Laterality: N/A;  . HERNIA REPAIR     umbilical    Family History  Problem Relation Age of Onset  . Diabetes Mother   . Hypertension Mother   . Stroke Father   . Hypertension Father   . Cancer Father   . Hyperthyroidism Sister   . Cancer Brother        prostate  . Hyperthyroidism Brother     Social History   Socioeconomic History  . Marital status: Single    Spouse name: Not on file  . Number of children: 0  . Years of education: Not on file  . Highest education level: High school graduate  Occupational History  . Not on file  Social Needs  . Financial resource strain: Not hard at all  . Food insecurity    Worry: Never true    Inability: Never true  . Transportation needs    Medical: No    Non-medical: No  Tobacco Use  . Smoking status: Never Smoker  . Smokeless tobacco: Never Used  Substance and Sexual Activity  . Alcohol use: No    Alcohol/week: 0.0 standard drinks  . Drug use: No  . Sexual activity: Not Currently  Lifestyle  . Physical activity    Days per week: 3 days    Minutes per session: 30 min  . Stress: Only a little  Relationships  . Social connections    Talks on phone: More than three times a week    Gets together: Once a week    Attends religious service: More than  4 times per year    Active member of club or organization: Yes    Attends meetings of clubs or organizations: More than 4 times per year    Relationship status: Never married  . Intimate partner violence    Fear of current or ex partner: No    Emotionally abused: No    Physically abused: No    Forced sexual activity: No  Other Topics Concern  .  Not on file  Social History Narrative   Not working since March 31 st 2020 because of COVID-19 may be re-hired after the Summer      Current Outpatient Medications:  .  acetaminophen (TYLENOL) 500 MG tablet, Take 1 tablet (500 mg total) by mouth every 6 (six) hours as needed., Disp: 500 tablet, Rfl: 0 .  Biotin 10000 MCG TABS, Take 1 tablet by mouth at bedtime., Disp: , Rfl:  .  Cholecalciferol (VITAMIN D) 2000 units CAPS, Take 1 capsule by mouth daily., Disp: , Rfl:  .  diclofenac sodium (VOLTAREN) 1 % GEL, APPLY 4 GRAMS TOPICALLY 4 (FOUR) TIMES DAILY., Disp: 200 g, Rfl: 0 .  fluticasone (FLONASE) 50 MCG/ACT nasal spray, SPRAY 2 SPRAYS INTO EACH NOSTRIL EVERY DAY, Disp: 16 mL, Rfl: 0 .  loratadine (CLARITIN) 10 MG tablet, TAKE 1 TABLET BY MOUTH TWICE A DAY, Disp: 60 tablet, Rfl: 1 .  mometasone (ELOCON) 0.1 % lotion, INSTILL 4 DROPS IN EACH EAR CANAL AT BEDTIME AND AS NEEDED FOR DRY SKIN/ITCHING, Disp: , Rfl:  .  prednisoLONE acetate (PRED FORTE) 1 % ophthalmic suspension, INSTILL 1 DROP INTO BOTH EYES 4 TIMES A DAY, Disp: , Rfl:  .  azelastine (ASTELIN) 0.1 % nasal spray, INSTILL 1 SPRAY INTO EACH NOSTRIL EVERY 12 HOURS AS NEEDED FOR ALLERGIES, Disp: , Rfl: 11  No Known Allergies   ROS  Constitutional: Negative for fever or weight change.  Respiratory: Negative for cough and shortness of breath.   Cardiovascular: Negative for chest pain or palpitations.  Gastrointestinal: Negative for abdominal pain, no bowel changes.  Musculoskeletal: Negative for gait problem or joint swelling.  Skin: Negative for rash.  Neurological: Negative for  dizziness or headache.  No other specific complaints in a complete review of systems (except as listed in HPI above).  Objective  Vitals:   05/11/19 0827  BP: 122/78  Pulse: (!) 59  Resp: 14  Temp: (!) 97.5 F (36.4 C)  SpO2: 99%  Weight: 200 lb 11.2 oz (91 kg)  Height: 5' 4" (1.626 m)    Body mass index is 34.45 kg/m.  Physical Exam  Constitutional: Patient appears well-developed and well-nourished. No distress.  HENT: Head: Normocephalic and atraumatic. Ears: B TMs ok, no erythema or effusion; Nose: Nose normal. Mouth/Throat: not done - COVID19  Eyes: Conjunctivae and EOM are normal. Pupils are equal, round, and reactive to light. No scleral icterus.  Neck: Normal range of motion. Neck supple. No JVD present. No thyromegaly present.  Cardiovascular: Normal rate, regular rhythm and normal heart sounds.  No murmur heard. 2 plus non pitting BLE edema. Pulmonary/Chest: Effort normal and breath sounds normal. No respiratory distress. Abdominal: Soft. Bowel sounds are normal, no distension. There is no tenderness. no masses Breast: no lumps or masses, no nipple discharge or rashes FEMALE GENITALIA:  Normal external genitalia, atrophy present  Normal bimanual exam RECTAL: not done Musculoskeletal: Normal range of motion, crepitus with extension of left knee, mild effusion left knee  Neurological: he is alert and oriented to person, place, and time. No cranial nerve deficit. Coordination, balance, strength, speech and gait are normal.  Skin: Skin is warm and dry. No rash noted. No erythema.  Psychiatric: Patient has a normal mood and affect. behavior is normal. Judgment and thought content normal.  PHQ2/9: Depression screen PHQ 2/9 05/11/2019 03/06/2019 12/31/2018 05/07/2018 02/24/2018  Decreased Interest 0 0 0 0 0  Down, Depressed, Hopeless 0 0 0 0 0  PHQ - 2 Score   0 0 0 0 0  Altered sleeping 0 0 1 - -  Tired, decreased energy 0 0 1 - -  Change in appetite 0 0 2 - -  Feeling bad  or failure about yourself  0 0 0 - -  Trouble concentrating 0 0 0 - -  Moving slowly or fidgety/restless 0 0 0 - -  Suicidal thoughts 0 0 0 - -  PHQ-9 Score 0 0 4 - -  Difficult doing work/chores Not difficult at all - Not difficult at all - -     Fall Risk: Fall Risk  05/11/2019 03/06/2019 12/31/2018 08/19/2018 05/07/2018  Falls in the past year? 0 0 - 0 Yes  Number falls in past yr: 0 0 0 - 1  Comment - - - - dizziness  Injury with Fall? 0 0 0 - Yes  Comment - - - - scratch on right knee     Functional Status Survey: Is the patient deaf or have difficulty hearing?: No Does the patient have difficulty seeing, even when wearing glasses/contacts?: No Does the patient have difficulty concentrating, remembering, or making decisions?: No Does the patient have difficulty walking or climbing stairs?: No Does the patient have difficulty dressing or bathing?: No Does the patient have difficulty doing errands alone such as visiting a doctor's office or shopping?: No   Assessment & Plan  1. Well adult health check  - CBC with Differential/Platelet - COMPLETE METABOLIC PANEL WITH GFR - Lipid panel - TSH - VITAMIN D 25 Hydroxy (Vit-D Deficiency, Fractures) - Sedimentation rate - C-reactive protein - Hemoglobin A1c  2. Screening for breast cancer  - MM DIAG BREAST TOMO BILATERAL; Future  3. Pruritus  - CBC with Differential/Platelet - TSH - Sedimentation rate - C-reactive protein  4. Vitamin D deficiency  - VITAMIN D 25 Hydroxy (Vit-D Deficiency, Fractures)  5. Leukopenia, unspecified type  CBC  6. Primary osteoarthritis of left knee   7. Borderline diabetes  - COMPLETE METABOLIC PANEL WITH GFR - Hemoglobin A1c  8. B12 deficiency  B12   9. Lipid screening  - Lipid panel  -USPSTF grade A and B recommendations reviewed with patient; age-appropriate recommendations, preventive care, screening tests, etc discussed and encouraged; healthy living encouraged; see  AVS for patient education given to patient -Discussed importance of 150 minutes of physical activity weekly, eat two servings of fish weekly, eat one serving of tree nuts ( cashews, pistachios, pecans, almonds.[name]) every other day, eat 6 servings of fruit/vegetables daily and drink plenty of water and avoid sweet beverages.

## 2019-05-11 NOTE — Patient Instructions (Signed)

## 2019-05-12 LAB — LIPID PANEL
Cholesterol: 185 mg/dL (ref <200)
HDL: 57 mg/dL (ref >=50)
LDL Cholesterol (Calc): 111 mg/dL (calc) — ABNORMAL HIGH
Non-HDL Cholesterol (Calc): 128 mg/dL (calc) (ref <130)
Total CHOL/HDL Ratio: 3.2 (calc) (ref <5.0)
Triglycerides: 79 mg/dL (ref <150)

## 2019-05-12 LAB — CBC WITH DIFFERENTIAL/PLATELET
Absolute Monocytes: 441 cells/uL (ref 200–950)
Basophils Absolute: 20 cells/uL (ref 0–200)
Basophils Relative: 0.7 %
Eosinophils Absolute: 90 cells/uL (ref 15–500)
Eosinophils Relative: 3.1 %
HCT: 40 % (ref 35.0–45.0)
Hemoglobin: 12.6 g/dL (ref 11.7–15.5)
Lymphs Abs: 1479 cells/uL (ref 850–3900)
MCH: 29.9 pg (ref 27.0–33.0)
MCHC: 31.5 g/dL — ABNORMAL LOW (ref 32.0–36.0)
MCV: 95 fL (ref 80.0–100.0)
MPV: 9.7 fL (ref 7.5–12.5)
Monocytes Relative: 15.2 %
Neutro Abs: 870 cells/uL — ABNORMAL LOW (ref 1500–7800)
Neutrophils Relative %: 30 %
Platelets: 196 10*3/uL (ref 140–400)
RBC: 4.21 10*6/uL (ref 3.80–5.10)
RDW: 11.9 % (ref 11.0–15.0)
Total Lymphocyte: 51 %
WBC: 2.9 10*3/uL — ABNORMAL LOW (ref 3.8–10.8)

## 2019-05-12 LAB — SEDIMENTATION RATE: Sed Rate: 2 mm/h (ref 0–30)

## 2019-05-12 LAB — COMPLETE METABOLIC PANEL WITH GFR
AG Ratio: 1.7 (calc) (ref 1.0–2.5)
ALT: 13 U/L (ref 6–29)
AST: 17 U/L (ref 10–35)
Albumin: 4.1 g/dL (ref 3.6–5.1)
Alkaline phosphatase (APISO): 32 U/L — ABNORMAL LOW (ref 37–153)
BUN: 14 mg/dL (ref 7–25)
CO2: 31 mmol/L (ref 20–32)
Calcium: 9.1 mg/dL (ref 8.6–10.4)
Chloride: 107 mmol/L (ref 98–110)
Creat: 0.61 mg/dL (ref 0.50–0.99)
GFR, Est African American: 113 mL/min/{1.73_m2} (ref >=60)
GFR, Est Non African American: 98 mL/min/{1.73_m2} (ref >=60)
Globulin: 2.4 g/dL (calc) (ref 1.9–3.7)
Glucose, Bld: 81 mg/dL (ref 65–99)
Potassium: 3.9 mmol/L (ref 3.5–5.3)
Sodium: 143 mmol/L (ref 135–146)
Total Bilirubin: 0.9 mg/dL (ref 0.2–1.2)
Total Protein: 6.5 g/dL (ref 6.1–8.1)

## 2019-05-12 LAB — HEMOGLOBIN A1C
Hgb A1c MFr Bld: 5.5 % of total Hgb (ref <5.7)
Mean Plasma Glucose: 111 (calc)
eAG (mmol/L): 6.2 (calc)

## 2019-05-12 LAB — VITAMIN B12: Vitamin B-12: 402 pg/mL (ref 200–1100)

## 2019-05-12 LAB — VITAMIN D 25 HYDROXY (VIT D DEFICIENCY, FRACTURES): Vit D, 25-Hydroxy: 27 ng/mL — ABNORMAL LOW (ref 30–100)

## 2019-05-12 LAB — TSH: TSH: 2.2 mIU/L (ref 0.40–4.50)

## 2019-05-12 LAB — C-REACTIVE PROTEIN: CRP: 0.7 mg/L (ref <8.0)

## 2019-05-21 ENCOUNTER — Other Ambulatory Visit: Payer: Self-pay | Admitting: Family Medicine

## 2019-05-25 ENCOUNTER — Other Ambulatory Visit: Payer: Self-pay | Admitting: Family Medicine

## 2019-05-25 DIAGNOSIS — M1712 Unilateral primary osteoarthritis, left knee: Secondary | ICD-10-CM

## 2019-05-27 ENCOUNTER — Telehealth: Payer: Self-pay

## 2019-05-27 NOTE — Telephone Encounter (Signed)
Copied from [place] [phone]. Topic: General - Other >> May 27, 2019 12:00 PM Celene Kras A wrote: Reason for CRM: Pt called and is requesting to have her lab results from physical in august. Please advise.   I called patient with results. She is not on MY CHART.

## 2019-05-29 ENCOUNTER — Other Ambulatory Visit: Payer: Self-pay | Admitting: Family Medicine

## 2019-05-29 DIAGNOSIS — Z1231 Encounter for screening mammogram for malignant neoplasm of breast: Secondary | ICD-10-CM

## 2019-06-29 ENCOUNTER — Other Ambulatory Visit: Payer: Self-pay | Admitting: Family Medicine

## 2019-06-29 DIAGNOSIS — M1712 Unilateral primary osteoarthritis, left knee: Secondary | ICD-10-CM

## 2019-07-06 ENCOUNTER — Ambulatory Visit
Admission: RE | Admit: 2019-07-06 | Discharge: 2019-07-06 | Disposition: A | Discharge status: Home / Self Care | Payer: BC Managed Care – PPO | Source: Ambulatory Visit | Attending: Family Medicine | Admitting: Family Medicine

## 2019-07-06 DIAGNOSIS — Z1231 Encounter for screening mammogram for malignant neoplasm of breast: Secondary | ICD-10-CM | POA: Diagnosis not present

## 2019-07-08 ENCOUNTER — Telehealth: Payer: Self-pay

## 2019-07-08 NOTE — Telephone Encounter (Signed)
Left voicemail that her Mammogram came back normal-repeat in 1 year.

## 2019-07-08 NOTE — Telephone Encounter (Signed)
Copied from [place] [phone]. Topic: General - Other >> Jul 07, 2019  3:31 PM Antonieta Iba C wrote: Reason for CRM: pt called in for her mammogram results   CB: [phone] >> Jul 08, 2019 10:44 AM Sheran Luz wrote: Patient calling again for results.   CB# [phone]

## 2019-07-23 ENCOUNTER — Other Ambulatory Visit: Payer: Self-pay | Admitting: Family Medicine

## 2019-07-24 ENCOUNTER — Other Ambulatory Visit: Payer: Self-pay | Admitting: Family Medicine

## 2019-08-19 ENCOUNTER — Other Ambulatory Visit: Payer: Self-pay

## 2019-08-19 ENCOUNTER — Ambulatory Visit: Payer: BC Managed Care – PPO | Admitting: Family Medicine

## 2019-08-19 ENCOUNTER — Encounter: Payer: Self-pay | Admitting: Family Medicine

## 2019-08-19 VITALS — BP 110/72 | HR 81 | Temp 97.7°F | Resp 16 | Ht 64.0 in | Wt 203.2 lb

## 2019-08-19 DIAGNOSIS — R21 Rash and other nonspecific skin eruption: Secondary | ICD-10-CM

## 2019-08-19 DIAGNOSIS — M1712 Unilateral primary osteoarthritis, left knee: Secondary | ICD-10-CM

## 2019-08-19 MED ORDER — TRIAMCINOLONE ACETONIDE 0.1 % EX CREA: 1.0000 "application " | TOPICAL_CREAM | Freq: Two times a day (BID) | CUTANEOUS | 0 refills | Status: DC

## 2019-08-19 NOTE — Progress Notes (Signed)
Name: Kathy Benjamin   MRN: [id number]    DOB: 10-27-1955   Date:08/19/2019       Progress Note  Subjective  Chief Complaint  Chief Complaint  Patient presents with  . Knee Pain    HPI  Left knee pain: she came in this past Summer having worsening of knee pain, we gave her voltaren gel and she has been able to wean self off Tylenol from three times daily to at most once a day. Average pain is down to 4/10 . Pain is usually worse with activity or standing, better with rest. She has effusion that is stable. She noticed a rash around left knee and is concerned that is secondary to the gel. It is bump and initially itching but doing better since started on hydrocortisone cream    Patient Active Problem List   Diagnosis Date Noted  . Osteopenia 07/02/2018  . Bradycardia 05/07/2018  . Leucocytosis 05/10/2017  . Urge incontinence of urine 05/06/2017  . Osteoarthritis of right hip 05/06/2017  . Post herpetic neuralgia 08/15/2015  . Bunion of left foot 05/02/2015  . H/O: hysterectomy 05/02/2015  . Edema leg 04/29/2015  . Obesity (BMI 30.0-34.9) 04/29/2015  . Primary localized osteoarthrosis, lower leg 04/29/2015  . Perennial allergic rhinitis with seasonal variation 04/29/2015  . Borderline diabetes 04/29/2015  . Menopausal symptom 04/29/2015  . Vitamin D deficiency 04/29/2015  . Abnormal electrocardiogram 04/02/2007    Past Surgical History:  Procedure Laterality Date  . ABDOMINAL HYSTERECTOMY    . BREAST EXCISIONAL BIOPSY Right 2009   neg  . BREAST SURGERY Right [id number]   biopsy  . COLONOSCOPY    . COLONOSCOPY WITH PROPOFOL N/A 08/05/2015   Procedure: COLONOSCOPY WITH PROPOFOL;  Surgeon: Lucilla Lame, MD;  Location: [place];  Service: Endoscopy;  Laterality: N/A;  . HERNIA REPAIR     umbilical    Family History  Problem Relation Age of Onset  . Diabetes Mother   . Hypertension Mother   . Stroke Father   . Hypertension Father   . Cancer Father   .  Hyperthyroidism Sister   . Cancer Brother        prostate  . Hyperthyroidism Brother     Social History   Socioeconomic History  . Marital status: Single    Spouse name: Not on file  . Number of children: 0  . Years of education: Not on file  . Highest education level: High school graduate  Occupational History  . Not on file  Social Needs  . Financial resource strain: Not hard at all  . Food insecurity    Worry: Never true    Inability: Never true  . Transportation needs    Medical: No    Non-medical: No  Tobacco Use  . Smoking status: Never Smoker  . Smokeless tobacco: Never Used  Substance and Sexual Activity  . Alcohol use: No    Alcohol/week: 0.0 standard drinks  . Drug use: No  . Sexual activity: Not Currently  Lifestyle  . Physical activity    Days per week: 3 days    Minutes per session: 30 min  . Stress: Only a little  Relationships  . Social connections    Talks on phone: More than three times a week    Gets together: Once a week    Attends religious service: More than 4 times per year    Active member of club or organization: Yes    Attends meetings of clubs  or organizations: More than 4 times per year    Relationship status: Never married  . Intimate partner violence    Fear of current or ex partner: No    Emotionally abused: No    Physically abused: No    Forced sexual activity: No  Other Topics Concern  . Not on file  Social History Narrative   Not working since March 31 st 2020 because of COVID-19 may be re-hired after the Summer      Current Outpatient Medications:  .  acetaminophen (TYLENOL) 500 MG tablet, Take 1 tablet (500 mg total) by mouth every 6 (six) hours as needed., Disp: 500 tablet, Rfl: 0 .  azelastine (ASTELIN) 0.1 % nasal spray, INSTILL 1 SPRAY INTO EACH NOSTRIL EVERY 12 HOURS AS NEEDED FOR ALLERGIES, Disp: , Rfl: 11 .  Biotin 24580 MCG TABS, Take 1 tablet by mouth at bedtime., Disp: , Rfl:  .  Cholecalciferol (VITAMIN D) 2000  units CAPS, Take 1 capsule by mouth daily., Disp: , Rfl:  .  diclofenac sodium (VOLTAREN) 1 % GEL, APPLY 4 GRAMS TOPICALLY 4 (FOUR) TIMES DAILY., Disp: 200 g, Rfl: 0 .  fluticasone (FLONASE) 50 MCG/ACT nasal spray, SPRAY 2 SPRAYS INTO EACH NOSTRIL EVERY DAY, Disp: 16 mL, Rfl: 0 .  loratadine (CLARITIN) 10 MG tablet, TAKE 1 TABLET BY MOUTH TWICE A DAY, Disp: 60 tablet, Rfl: 1 .  mometasone (ELOCON) 0.1 % lotion, INSTILL 4 DROPS IN EACH EAR CANAL AT BEDTIME AND AS NEEDED FOR DRY SKIN/ITCHING, Disp: , Rfl:  .  prednisoLONE acetate (PRED FORTE) 1 % ophthalmic suspension, INSTILL 1 DROP INTO BOTH EYES 4 TIMES A DAY, Disp: , Rfl:   No Known Allergies  I personally reviewed active problem list, medication list, allergies, family history, social history, health maintenance with the patient/caregiver today.   ROS  Ten systems reviewed and is negative except as mentioned in HPI   Objective  Vitals:   08/19/19 1410  BP: 110/72  Pulse: 81  Resp: 16  Temp: 97.7 F (36.5 C)  TempSrc: Temporal  SpO2: 96%  Weight: 203 lb 3.2 oz (92.2 kg)  Height: 5\' 4"  (1.626 m)    Body mass index is 34.88 kg/m.  Physical Exam  Constitutional: Patient appears well-developed and well-nourished. Obese  No distress.  HEENT: head atraumatic, normocephalic, pupils equal and reactive to ligh Cardiovascular: Normal rate, regular rhythm and normal heart sounds.  No murmur heard. BLE edema - lymphedema Pulmonary/Chest: Effort normal and breath sounds normal. No respiratory distress. Abdominal: Soft.  There is no tenderness. Muscular Skeletal: mild effusion, crepitus with extension of left knee, no redness or increase in warmth.  Skin: bumpy rash , no oozing below left knee and medially. No redness  Psychiatric: Patient has a normal mood and affect. behavior is normal. Judgment and thought content normal.   PHQ2/9: Depression screen [facility] 2/9 05/11/2019 03/06/2019 12/31/2018 05/07/2018 02/24/2018  Decreased Interest 0  0 0 0 0  Down, Depressed, Hopeless 0 0 0 0 0  PHQ - 2 Score 0 0 0 0 0  Altered sleeping 0 0 1 - -  Tired, decreased energy 0 0 1 - -  Change in appetite 0 0 2 - -  Feeling bad or failure about yourself  0 0 0 - -  Trouble concentrating 0 0 0 - -  Moving slowly or fidgety/restless 0 0 0 - -  Suicidal thoughts 0 0 0 - -  PHQ-9 Score 0 0 4 - -  Difficult doing work/chores Not difficult at all - Not difficult at all - -    phq 9 is negative  Fall Risk: Fall Risk  05/11/2019 03/06/2019 12/31/2018 08/19/2018 05/07/2018  Falls in the past year? 0 0 - 0 Yes  Number falls in past yr: 0 0 0 - 1  Comment - - - - dizziness  Injury with Fall? 0 0 0 - Yes  Comment - - - - scratch on right knee      Assessment & Plan  1. Primary osteoarthritis of left knee   2. Rash  - triamcinolone cream (KENALOG) 0.1 %; Apply 1 application topically 2 (two) times daily.  Dispense: 45 g; Refill: 0

## 2019-08-31 ENCOUNTER — Other Ambulatory Visit: Payer: Self-pay | Admitting: Family Medicine

## 2019-08-31 DIAGNOSIS — M1712 Unilateral primary osteoarthritis, left knee: Secondary | ICD-10-CM

## 2019-09-15 ENCOUNTER — Other Ambulatory Visit: Payer: Self-pay | Admitting: Family Medicine

## 2019-09-16 ENCOUNTER — Other Ambulatory Visit: Payer: Self-pay | Admitting: Physician Assistant

## 2019-09-16 ENCOUNTER — Other Ambulatory Visit (HOSPITAL_COMMUNITY): Payer: Self-pay | Admitting: Physician Assistant

## 2019-09-16 DIAGNOSIS — H8111 Benign paroxysmal vertigo, right ear: Secondary | ICD-10-CM | POA: Diagnosis not present

## 2019-09-16 DIAGNOSIS — R42 Dizziness and giddiness: Secondary | ICD-10-CM

## 2019-09-23 DIAGNOSIS — H8111 Benign paroxysmal vertigo, right ear: Secondary | ICD-10-CM | POA: Diagnosis not present

## 2019-09-25 ENCOUNTER — Ambulatory Visit: Payer: Self-pay

## 2019-10-14 ENCOUNTER — Other Ambulatory Visit: Payer: Self-pay | Admitting: Family Medicine

## 2019-10-21 ENCOUNTER — Telehealth: Payer: Self-pay | Admitting: Family Medicine

## 2019-10-21 NOTE — Telephone Encounter (Signed)
Copied from CRM [phone] [phone]. Topic: General - Other >> Oct 21, 2019  2:00 PM Tamela Oddi wrote: Reason for CRM: Patient would like the nurse to call regarding her diclofenac Sodium (VOLTAREN) 1 % GEL.  Patient stated that it is now very expensive and would like to know if she can use the Voltaren OTC instead.  Please advise and let patient know at [phone]

## 2019-10-21 NOTE — Telephone Encounter (Signed)
Yes; they are the same.

## 2019-10-21 NOTE — Telephone Encounter (Signed)
Patient is returning [facility] call regarding medication.  Message was relayed to patient but she still would like to speak to [facility] Call back [phone]

## 2019-10-22 NOTE — Telephone Encounter (Signed)
Left message for a return call to discuss voltran Rx.

## 2019-10-22 NOTE — Telephone Encounter (Signed)
Left message for patient the Voltaren Gel prescription is the same as over the counter and per Dr. Carlynn Purl ok to switch them out due to cost.

## 2019-10-26 ENCOUNTER — Telehealth: Payer: Self-pay | Admitting: Family Medicine

## 2019-10-26 NOTE — Telephone Encounter (Signed)
Sent a MyChart message stating Dr. Carlynn Purl said it would be ok to switch from prescription to OTC Voltaren Gel.

## 2019-10-26 NOTE — Telephone Encounter (Signed)
Patient is calling regarding diclofenac Sodium (VOLTAREN) 1 % GEL [[id number]] the insurance used to pay for it However, now they will not pay for it. Patient would like to know can she take the OTC Voltaren it is cheaper. Please advise CB= [phone]

## 2019-11-04 ENCOUNTER — Encounter: Payer: Self-pay | Admitting: Family Medicine

## 2019-11-04 ENCOUNTER — Ambulatory Visit (INDEPENDENT_AMBULATORY_CARE_PROVIDER_SITE_OTHER): Payer: BC Managed Care – PPO | Admitting: Family Medicine

## 2019-11-04 ENCOUNTER — Other Ambulatory Visit: Payer: Self-pay

## 2019-11-04 VITALS — BP 118/78 | HR 89 | Temp 97.1°F | Resp 16 | Ht 64.0 in | Wt 203.6 lb

## 2019-11-04 DIAGNOSIS — E538 Deficiency of other specified B group vitamins: Secondary | ICD-10-CM | POA: Diagnosis not present

## 2019-11-04 DIAGNOSIS — R600 Localized edema: Secondary | ICD-10-CM

## 2019-11-04 DIAGNOSIS — M1712 Unilateral primary osteoarthritis, left knee: Secondary | ICD-10-CM | POA: Diagnosis not present

## 2019-11-04 DIAGNOSIS — E559 Vitamin D deficiency, unspecified: Secondary | ICD-10-CM

## 2019-11-04 DIAGNOSIS — D72819 Decreased white blood cell count, unspecified: Secondary | ICD-10-CM

## 2019-11-04 DIAGNOSIS — M25462 Effusion, left knee: Secondary | ICD-10-CM

## 2019-11-04 MED ORDER — DICLOFENAC SODIUM 1 % EX GEL: 4.0000 g | Freq: Four times a day (QID) | CUTANEOUS | 1 refills | Status: DC

## 2019-11-04 NOTE — Progress Notes (Signed)
Name: Kathy Benjamin   MRN: [id number]    DOB: 21-Jun-1956   Date:11/04/2019       Progress Note  Subjective  Chief Complaint  Chief Complaint  Patient presents with  . Medication Refill    6 month F/U  . Pre-diabetes  . Leucopenia  . OA left knee    Has a rash on it    HPI  Leucopenia: stable, discussed rechecking today or next visit. Reviewed previous records   Pre-diabetes: her A1C went from  5.6% to 5.8% and last visit was down to 5.5%  She denies polyphagia, polydipsia or polyuria, discussed importance of changing her diet.   OA left knee: using topical voltaren gel and seems to help with the pain, it is causing a little rash on left knee but she uses a steroid cream prn and it controls symptoms   Patient Active Problem List   Diagnosis Date Noted  . Osteopenia 07/02/2018  . Bradycardia 05/07/2018  . Leucocytosis 05/10/2017  . Urge incontinence of urine 05/06/2017  . Osteoarthritis of right hip 05/06/2017  . Post herpetic neuralgia 08/15/2015  . Bunion of left foot 05/02/2015  . H/O: hysterectomy 05/02/2015  . Edema leg 04/29/2015  . Obesity (BMI 30.0-34.9) 04/29/2015  . Primary localized osteoarthrosis, lower leg 04/29/2015  . Perennial allergic rhinitis with seasonal variation 04/29/2015  . Borderline diabetes 04/29/2015  . Menopausal symptom 04/29/2015  . Vitamin D deficiency 04/29/2015  . Abnormal electrocardiogram 04/02/2007    Past Surgical History:  Procedure Laterality Date  . ABDOMINAL HYSTERECTOMY    . BREAST EXCISIONAL BIOPSY Right 2009   neg  . BREAST SURGERY Right [id number]   biopsy  . COLONOSCOPY    . COLONOSCOPY WITH PROPOFOL N/A 08/05/2015   Procedure: COLONOSCOPY WITH PROPOFOL;  Surgeon: Lucilla Lame, MD;  Location: [facility];  Service: Endoscopy;  Laterality: N/A;  . HERNIA REPAIR     umbilical    Family History  Problem Relation Age of Onset  . Diabetes Mother   . Hypertension Mother   . Stroke Father   .  Hypertension Father   . Cancer Father   . Hyperthyroidism Sister   . Cancer Brother        prostate  . Hyperthyroidism Brother   . Kidney disease Sister     Social History   Tobacco Use  . Smoking status: Never Smoker  . Smokeless tobacco: Never Used  Substance Use Topics  . Alcohol use: No    Alcohol/week: 0.0 standard drinks  . Drug use: No     Current Outpatient Medications:  .  acetaminophen (TYLENOL) 500 MG tablet, Take 1 tablet (500 mg total) by mouth every 6 (six) hours as needed., Disp: 500 tablet, Rfl: 0 .  azelastine (ASTELIN) 0.1 % nasal spray, INSTILL 1 SPRAY INTO EACH NOSTRIL EVERY 12 HOURS AS NEEDED FOR ALLERGIES, Disp: , Rfl: 11 .  Biotin 10000 MCG TABS, Take 1 tablet by mouth at bedtime., Disp: , Rfl:  .  Cholecalciferol (VITAMIN D) 2000 units CAPS, Take 1 capsule by mouth daily., Disp: , Rfl:  .  diclofenac Sodium (VOLTAREN) 1 % GEL, Apply 4 g topically 4 (four) times daily., Disp: 100 g, Rfl: 1 .  fluticasone (FLONASE) 50 MCG/ACT nasal spray, SPRAY 2 SPRAYS INTO EACH NOSTRIL EVERY DAY, Disp: 16 mL, Rfl: 0 .  loratadine (CLARITIN) 10 MG tablet, TAKE 1 TABLET BY MOUTH TWICE A DAY, Disp: 60 tablet, Rfl: 1 .  mometasone (ELOCON) 0.1 % lotion,  INSTILL 4 DROPS IN EACH EAR CANAL AT BEDTIME AND AS NEEDED FOR DRY SKIN/ITCHING, Disp: , Rfl:  .  prednisoLONE acetate (PRED FORTE) 1 % ophthalmic suspension, INSTILL 1 DROP INTO BOTH EYES 4 TIMES A DAY, Disp: , Rfl:  .  triamcinolone cream (KENALOG) 0.1 %, Apply 1 application topically 2 (two) times daily., Disp: 45 g, Rfl: 0  No Known Allergies  I personally reviewed active problem list, medication list, allergies, family history, social history, health maintenance with the patient/caregiver today.   ROS  Constitutional: Negative for fever, positive for  weight change.  Respiratory: Negative for cough and shortness of breath.   Cardiovascular: Negative for chest pain or palpitations.  Gastrointestinal: Negative for  abdominal pain, no bowel changes.  Musculoskeletal: Negative for gait problem or joint swelling.  Skin: Positive  for rash on left knee .  Neurological: Negative for dizziness or headache.  No other specific complaints in a complete review of systems (except as listed in HPI above).  Objective  Vitals:   11/04/19 1534  BP: 118/78  Pulse: 89  Resp: 16  Temp: (!) 97.1 F (36.2 C)  TempSrc: Temporal  SpO2: 97%  Weight: 203 lb 9.6 oz (92.4 kg)  Height: 5\' 4"  (1.626 m)    Body mass index is 34.95 kg/m.  Physical Exam  Constitutional: Patient appears well-developed and well-nourished. Obese No distress.  HEENT: head atraumatic, normocephalic, pupils equal and reactive to light Cardiovascular: Normal rate, regular rhythm and normal heart sounds.  No murmur heard. Non pitting  BLE edema. Pulmonary/Chest: Effort normal and breath sounds normal. No respiratory distress. Abdominal: Soft.  There is no tenderness. Muscular Skeletal: effusion of left knee, no crepitus , mild eczematous patch on left anterior knee  Psychiatric: Patient has a normal mood and affect. behavior is normal. Judgment and thought content normal.  PHQ2/9: Depression screen [facility] 2/9 11/04/2019 08/19/2019 05/11/2019 03/06/2019 12/31/2018  Decreased Interest 0 0 0 0 0  Down, Depressed, Hopeless 0 0 0 0 0  PHQ - 2 Score 0 0 0 0 0  Altered sleeping 0 0 0 0 1  Tired, decreased energy 0 0 0 0 1  Change in appetite 0 0 0 0 2  Feeling bad or failure about yourself  0 0 0 0 0  Trouble concentrating 0 0 0 0 0  Moving slowly or fidgety/restless 0 0 0 0 0  Suicidal thoughts 0 0 0 0 0  PHQ-9 Score 0 0 0 0 4  Difficult doing work/chores Not difficult at all - Not difficult at all - Not difficult at all    phq 9 is negative    Fall Risk: Fall Risk  11/04/2019 08/19/2019 05/11/2019 03/06/2019 12/31/2018  Falls in the past year? 0 0 0 0 -  Number falls in past yr: 0 0 0 0 0  Comment - - - - -  Injury with Fall? 0 0 0 0 0   Comment - - - - -     Assessment & Plan  1. Primary osteoarthritis of left knee  - diclofenac Sodium (VOLTAREN) 1 % GEL; Apply 4 g topically 4 (four) times daily.  Dispense: 100 g; Refill: 1  2. Leukopenia, unspecified type  Recheck next visit   3. Vitamin D deficiency  Taking supplementation otc  4. B12 deficiency  Needs to take sublingual   5. Effusion of left knee  stable  6. Bilateral lower extremity edema

## 2019-11-04 NOTE — Patient Instructions (Signed)
You can by fluticasone otc

## 2019-11-12 ENCOUNTER — Ambulatory Visit: Payer: BLUE CROSS/BLUE SHIELD | Admitting: Family Medicine

## 2019-11-13 ENCOUNTER — Ambulatory Visit: Payer: BLUE CROSS/BLUE SHIELD | Admitting: Family Medicine

## 2019-11-17 DIAGNOSIS — H8111 Benign paroxysmal vertigo, right ear: Secondary | ICD-10-CM | POA: Diagnosis not present

## 2019-11-26 DIAGNOSIS — R42 Dizziness and giddiness: Secondary | ICD-10-CM | POA: Diagnosis not present

## 2019-11-27 DIAGNOSIS — Z20822 Contact with and (suspected) exposure to covid-19: Secondary | ICD-10-CM | POA: Diagnosis not present

## 2019-12-12 ENCOUNTER — Ambulatory Visit: Payer: Self-pay | Attending: Internal Medicine

## 2019-12-12 DIAGNOSIS — Z23 Encounter for immunization: Secondary | ICD-10-CM

## 2019-12-12 NOTE — Progress Notes (Signed)
   Covid-19 Vaccination Clinic  Name:  Kathy Benjamin    MRN: [id number] DOB: Jan 03, 1956  12/12/2019  Kathy Benjamin was observed post Covid-19 immunization for 15 minutes without incident. She was provided with Vaccine Information Sheet and instruction to access the V-Safe system.   Kathy Benjamin was instructed to call 911 with any severe reactions post vaccine: [name] Difficulty breathing  . Swelling of face and throat  . A fast heartbeat  . A bad rash all over body  . Dizziness and weakness   Immunizations Administered    Name Date Dose VIS Date Route   Pfizer COVID-19 Vaccine 12/12/2019  1:14 PM 0.3 mL 08/28/2019 Intramuscular   Manufacturer: [employer], [employer]   Lot: [id number]   NDC: [address]-6

## 2019-12-13 ENCOUNTER — Other Ambulatory Visit: Payer: Self-pay | Admitting: Family Medicine

## 2019-12-13 NOTE — Telephone Encounter (Signed)
Requested Prescriptions  Pending Prescriptions Disp Refills  . fluticasone (FLONASE) 50 MCG/ACT nasal spray [Pharmacy Med Name: FLUTICASONE PROP 50 MCG SPRAY] 16 mL 0    Sig: SPRAY 2 SPRAYS INTO EACH NOSTRIL EVERY DAY     Ear, Nose, and Throat: Nasal Preparations - Corticosteroids Passed - 12/13/2019 12:55 AM      Passed - Valid encounter within last 12 months    Recent Outpatient Visits          1 month ago Leukopenia, unspecified type   [facility] Alba Cory, MD   3 months ago Primary osteoarthritis of left knee   [facility] Alba Cory, MD   7 months ago Well adult health check   [facility] Alba Cory, MD   9 months ago Primary osteoarthritis of left knee   [facility] Alba Cory, MD   11 months ago Leukopenia, unspecified type   [facility] Alba Cory, MD      Future Appointments            In 5 months Carlynn Purl, Danna Hefty, MD [facility], [facility]

## 2020-01-01 ENCOUNTER — Other Ambulatory Visit: Payer: Self-pay | Admitting: Family Medicine

## 2020-01-05 ENCOUNTER — Ambulatory Visit: Payer: Self-pay

## 2020-01-12 ENCOUNTER — Ambulatory Visit: Payer: Self-pay | Attending: Internal Medicine

## 2020-01-12 DIAGNOSIS — Z23 Encounter for immunization: Secondary | ICD-10-CM

## 2020-01-12 NOTE — Progress Notes (Signed)
   Covid-19 Vaccination Clinic  Name:  Camara Renstrom    MRN: [id number] DOB: 1955-12-15  01/12/2020  Ms. Cappuccio was observed post Covid-19 immunization for 15 minutes without incident. She was provided with Vaccine Information Sheet and instruction to access the V-Safe system.   Ms. Kittle was instructed to call 911 with any severe reactions post vaccine: [name] Difficulty breathing  . Swelling of face and throat  . A fast heartbeat  . A bad rash all over body  . Dizziness and weakness   Immunizations Administered    Name Date Dose VIS Date Route   Pfizer COVID-19 Vaccine 01/12/2020  4:14 PM 0.3 mL 11/11/2018 Intramuscular   Manufacturer: [employer], [employer]   Lot: [id number]   NDC: [address]-3

## 2020-05-12 NOTE — Progress Notes (Addendum)
Name: Kathy Benjamin   MRN: [id number]    DOB: 1956/04/27   Date:05/13/2020       Progress Note  Subjective  Chief Complaint  Chief Complaint  Patient presents with  . Osteoarthritis  . Allergic Rhinitis   . Leg Swelling  . Obesity    HPI  Patient presents for annual CPE.  Diet: she has been cooking at home, baking her food, but she has been craving sweets lately  Exercise: not very active   USPSTF grade A and B recommendations     Office Visit from 05/13/2020 in [facility]  AUDIT-C Score 0     Depression: Phq 9 is  negative Depression screen [facility] 2/9 05/13/2020 05/13/2020 11/04/2019 08/19/2019 05/11/2019  Decreased Interest 0 0 0 0 0  Down, Depressed, Hopeless 0 0 0 0 0  PHQ - 2 Score 0 0 0 0 0  Altered sleeping 0 0 0 0 0  Tired, decreased energy 0 0 0 0 0  Change in appetite 0 0 0 0 0  Feeling bad or failure about yourself  0 0 0 0 0  Trouble concentrating 0 0 0 0 0  Moving slowly or fidgety/restless 0 0 0 0 0  Suicidal thoughts 0 0 0 0 0  PHQ-9 Score 0 0 0 0 0  Difficult doing work/chores - - Not difficult at all - Not difficult at all  Some recent data might be hidden   Hypertension: BP Readings from Last 3 Encounters:  05/13/20 100/70  11/04/19 118/78  08/19/19 110/72   Obesity: Wt Readings from Last 3 Encounters:  05/13/20 191 lb 12.8 oz (87 kg)  11/04/19 203 lb 9.6 oz (92.4 kg)  08/19/19 203 lb 3.2 oz (92.2 kg)   BMI Readings from Last 3 Encounters:  05/13/20 32.92 kg/m  11/04/19 34.95 kg/m  08/19/19 34.88 kg/m     Vaccines:   Shingrix: up to date  Pneumonia: educated and discussed with patient, start at 70  Flu:  educated and discussed with patient. She will get it at work in October   Hep C Screening: done  STD testing and prevention (HIV/chl/gon/syphilis): N/A  Intimate partner violence: negative screen  Sexual History/Pain during Intercourse: not sexually active in many years  Menstrual History/LMP/Abnormal  Bleeding: sp hysterectomy  Incontinence Symptoms: if she holds for a long time she needs to rush   Advanced Care Planning: A voluntary discussion about advance care planning including the explanation and discussion of advance directives.  Discussed health care proxy and Living will, and the patient was able to identify a health care proxy as sister - [name].  Patient does not have a living will at present time.   Breast cancer: 06/2018 - Last Mammogram:07/06/2019 - BRCA gene screening: N/A  Osteoporosis: 06/2018 - osteopenia left femur   Cervical cancer screening: N/A, hysterectomy   Skin cancer: Discussed monitoring for atypical lesions  Colorectal cancer: Repeat in 2026   Lung cancer: Low Dose CT Chest recommended if Age 30-80 years, 30 pack-year currently smoking OR have quit w/in 15years. Patient does not qualify.   ECG: 04/2018   Lipids: Lab Results  Component Value Date   CHOL 185 05/11/2019   CHOL 173 05/07/2018   CHOL 166 05/06/2017   Lab Results  Component Value Date   HDL 57 05/11/2019   HDL 59 05/07/2018   HDL 56 05/06/2017   Lab Results  Component Value Date   LDLCALC 111 (H) 05/11/2019   [place]  101 (H) 05/07/2018   LDLCALC 96 05/06/2017   Lab Results  Component Value Date   TRIG 79 05/11/2019   TRIG 50 05/07/2018   TRIG 72 05/06/2017   Lab Results  Component Value Date   CHOLHDL 3.2 05/11/2019   CHOLHDL 2.9 05/07/2018   CHOLHDL 3.0 05/06/2017   No results found for: LDLDIRECT  Glucose: Glucose, Bld  Date Value Ref Range Status  05/11/2019 81 65 - 99 mg/dL Final    Comment:    .            Fasting reference interval .   12/31/2018 83 65 - 99 mg/dL Final    Comment:    .            Fasting reference interval .   05/07/2018 76 65 - 99 mg/dL Final    Comment:    .            Fasting reference interval .     Patient Active Problem List   Diagnosis Date Noted  . Osteopenia 07/02/2018  . Bradycardia 05/07/2018  . Leucocytosis  05/10/2017  . Urge incontinence of urine 05/06/2017  . Osteoarthritis of right hip 05/06/2017  . Post herpetic neuralgia 08/15/2015  . Bunion of left foot 05/02/2015  . H/O: hysterectomy 05/02/2015  . Edema leg 04/29/2015  . Obesity (BMI 30.0-34.9) 04/29/2015  . Primary localized osteoarthrosis, lower leg 04/29/2015  . Perennial allergic rhinitis with seasonal variation 04/29/2015  . Borderline diabetes 04/29/2015  . Menopausal symptom 04/29/2015  . Vitamin D deficiency 04/29/2015  . Abnormal electrocardiogram 04/02/2007    Past Surgical History:  Procedure Laterality Date  . ABDOMINAL HYSTERECTOMY    . BREAST EXCISIONAL BIOPSY Right 2009   neg  . BREAST SURGERY Right [id number]   biopsy  . COLONOSCOPY    . COLONOSCOPY WITH PROPOFOL N/A 08/05/2015   Procedure: COLONOSCOPY WITH PROPOFOL;  Surgeon: Lucilla Lame, MD;  Location: [place];  Service: Endoscopy;  Laterality: N/A;  . HERNIA REPAIR     umbilical    Family History  Problem Relation Age of Onset  . Diabetes Mother   . Hypertension Mother   . Stroke Father   . Hypertension Father   . Cancer Father   . Hyperthyroidism Sister   . Cancer Brother        prostate  . Hyperthyroidism Brother   . Kidney disease Sister     Social History   Socioeconomic History  . Marital status: Single    Spouse name: Not on file  . Number of children: 0  . Years of education: Not on file  . Highest education level: High school graduate  Occupational History  . Not on file  Tobacco Use  . Smoking status: Never Smoker  . Smokeless tobacco: Never Used  Vaping Use  . Vaping Use: Never used  Substance and Sexual Activity  . Alcohol use: No    Alcohol/week: 0.0 standard drinks  . Drug use: No  . Sexual activity: Not Currently  Other Topics Concern  . Not on file  Social History Narrative   Not working since March 31 st 2020 because of COVID-19 may be re-hired after the Summer    Social Determinants of [employer]: Low Risk   . Difficulty of Paying Living Expenses: Not hard at all  Food Insecurity: No Food Insecurity  . Worried About Charity fundraiser in the Last Year: Never true  . Ran  Out of Food in the Last Year: Never true  Transportation Needs: No Transportation Needs  . Lack of Transportation (Medical): No  . Lack of Transportation (Non-Medical): No  Physical Activity: Sufficiently Active  . Days of Exercise per Week: 5 days  . Minutes of Exercise per Session: 30 min  Stress: No Stress Concern Present  . Feeling of Stress : Not at all  Social Connections: Moderately Integrated  . Frequency of Communication with Friends and Family: More than three times a week  . Frequency of Social Gatherings with Friends and Family: More than three times a week  . Attends Religious Services: More than 4 times per year  . Active Member of Clubs or Organizations: Yes  . Attends Archivist Meetings: More than 4 times per year  . Marital Status: Never married  Intimate Partner Violence: Not At Risk  . Fear of Current or Ex-Partner: No  . Emotionally Abused: No  . Physically Abused: No  . Sexually Abused: No     Current Outpatient Medications:  .  acetaminophen (TYLENOL) 500 MG tablet, Take 1 tablet (500 mg total) by mouth every 6 (six) hours as needed., Disp: 500 tablet, Rfl: 0 .  Cholecalciferol (VITAMIN D) 2000 units CAPS, Take 1 capsule by mouth daily., Disp: , Rfl:  .  diclofenac Sodium (VOLTAREN) 1 % GEL, Apply 4 g topically 4 (four) times daily., Disp: 100 g, Rfl: 1 .  fluticasone (FLONASE) 50 MCG/ACT nasal spray, SPRAY 2 SPRAYS INTO EACH NOSTRIL EVERY DAY, Disp: 16 mL, Rfl: 4 .  loratadine (CLARITIN) 10 MG tablet, TAKE 1 TABLET BY MOUTH TWICE A DAY, Disp: 60 tablet, Rfl: 1 .  mometasone (ELOCON) 0.1 % lotion, INSTILL 4 DROPS IN EACH EAR CANAL AT BEDTIME AND AS NEEDED FOR DRY SKIN/ITCHING, Disp: , Rfl:  .  triamcinolone cream (KENALOG) 0.1 %, Apply 1  application topically 2 (two) times daily., Disp: 45 g, Rfl: 0 .  Biotin 10000 MCG TABS, Take 1 tablet by mouth at bedtime. (Patient not taking: Reported on 05/13/2020), Disp: , Rfl:   No Known Allergies   ROS  Constitutional: Negative for fever , positive for  weight change - working 10 hours day for the past couple of months .  Respiratory: Negative for cough and shortness of breath.   Cardiovascular: Negative for chest pain or palpitations.  Gastrointestinal: Negative for abdominal pain, no bowel changes.  Musculoskeletal: Negative for gait problem , positive for intermittent knee  joint swelling.  Skin: Negative for rash.  Neurological:positive for intermittent dizziness but no headache.  No other specific complaints in a complete review of systems (except as listed in HPI above).  Objective  Vitals:   05/13/20 0810  BP: 100/70  Pulse: 60  Resp: 16  Temp: 98.2 F (36.8 C)  TempSrc: Oral  SpO2: 98%  Weight: 191 lb 12.8 oz (87 kg)  Height: 5' 4"  (1.626 m)    Body mass index is 32.92 kg/m.  Physical Exam  Constitutional: Patient appears well-developed and well-nourished. No distress.  HENT: Head: Normocephalic and atraumatic. Ears: B TMs ok, no erythema or effusion; Nose: not done . Mouth/Throat: not done  Eyes: Conjunctivae and EOM are normal. Pupils are equal, round, and reactive to light. No scleral icterus.  Neck: Normal range of motion. Neck supple. No JVD present. No thyromegaly present.  Cardiovascular: Normal rate, regular rhythm and normal heart sounds.  No murmur heard. No BLE edema. Pulmonary/Chest: Effort normal and breath sounds normal. No respiratory  distress. Abdominal: Soft. Bowel sounds are normal, no distension. There is no tenderness. no masses Breast: no lumps or masses, no nipple discharge or rashes FEMALE GENITALIA:  No cervix, cystocele noticed, no rectocele RECTAL: not done Musculoskeletal: Normal range of motion, no joint effusions. Hammer toe  left 2nd toe, also has bunions  Neurological: he is alert and oriented to person, place, and time. No cranial nerve deficit. Coordination, balance, strength, speech and gait are normal.  Skin: Skin is warm and dry. No rash noted. No erythema.  Psychiatric: Patient has a normal mood and affect. behavior is normal. Judgment and thought content normal.  Fall Risk: Fall Risk  05/13/2020 11/04/2019 08/19/2019 05/11/2019 03/06/2019  Falls in the past year? 0 0 0 0 0  Number falls in past yr: 0 0 0 0 0  Comment - - - - -  Injury with Fall? 0 0 0 0 0  Comment - - - - -     Functional Status Survey: Is the patient deaf or have difficulty hearing?: No Does the patient have difficulty seeing, even when wearing glasses/contacts?: No Does the patient have difficulty concentrating, remembering, or making decisions?: No Does the patient have difficulty walking or climbing stairs?: Yes Does the patient have difficulty dressing or bathing?: No Does the patient have difficulty doing errands alone such as visiting a doctor's office or shopping?: No   Assessment & Plan  1. Well adult exam  - COMPLETE METABOLIC PANEL WITH GFR  2. Encounter for screening mammogram for malignant neoplasm of breast  - MM 3D SCREEN BREAST BILATERAL; Future  3. B12 deficiency  - B12 and Folate Panel  4. Leukopenia, unspecified type  - CBC with Differential/Platelet  5. Vitamin D deficiency  - VITAMIN D 25 Hydroxy (Vit-D Deficiency, Fractures)  6. Bilateral lower extremity edema  - COMPLETE METABOLIC PANEL WITH GFR  7. Lipid screening  - Lipid panel  8. Pre-diabetes  - Hemoglobin A1c   9. Pelvic floor weakness  She is not interested on referral to PT  10. Cystocele without uterine prolapse  Some urinary urgency and vaginal fullness when she stands up  -USPSTF grade A and B recommendations reviewed with patient; age-appropriate recommendations, preventive care, screening tests, etc discussed and  encouraged; healthy living encouraged; see AVS for patient education given to patient -Discussed importance of 150 minutes of physical activity weekly, eat two servings of fish weekly, eat one serving of tree nuts ( cashews, pistachios, pecans, almonds.[name]) every other day, eat 6 servings of fruit/vegetables daily and drink plenty of water and avoid sweet beverages.

## 2020-05-13 ENCOUNTER — Other Ambulatory Visit: Payer: Self-pay

## 2020-05-13 ENCOUNTER — Ambulatory Visit (INDEPENDENT_AMBULATORY_CARE_PROVIDER_SITE_OTHER): Payer: BC Managed Care – PPO | Admitting: Family Medicine

## 2020-05-13 ENCOUNTER — Encounter: Payer: Self-pay | Admitting: Family Medicine

## 2020-05-13 VITALS — BP 100/70 | HR 60 | Temp 98.2°F | Resp 16 | Ht 64.0 in | Wt 191.8 lb

## 2020-05-13 DIAGNOSIS — Z13220 Encounter for screening for lipoid disorders: Secondary | ICD-10-CM | POA: Diagnosis not present

## 2020-05-13 DIAGNOSIS — R7303 Prediabetes: Secondary | ICD-10-CM | POA: Diagnosis not present

## 2020-05-13 DIAGNOSIS — R600 Localized edema: Secondary | ICD-10-CM

## 2020-05-13 DIAGNOSIS — Z0000 Encounter for general adult medical examination without abnormal findings: Secondary | ICD-10-CM | POA: Diagnosis not present

## 2020-05-13 DIAGNOSIS — E538 Deficiency of other specified B group vitamins: Secondary | ICD-10-CM

## 2020-05-13 DIAGNOSIS — N8110 Cystocele, unspecified: Secondary | ICD-10-CM

## 2020-05-13 DIAGNOSIS — N8189 Other female genital prolapse: Secondary | ICD-10-CM

## 2020-05-13 DIAGNOSIS — D72819 Decreased white blood cell count, unspecified: Secondary | ICD-10-CM

## 2020-05-13 DIAGNOSIS — Z1231 Encounter for screening mammogram for malignant neoplasm of breast: Secondary | ICD-10-CM

## 2020-05-13 DIAGNOSIS — E559 Vitamin D deficiency, unspecified: Secondary | ICD-10-CM

## 2020-05-13 NOTE — Patient Instructions (Signed)

## 2020-05-13 NOTE — Progress Notes (Deleted)
She has a physical job

## 2020-05-14 LAB — CBC WITH DIFFERENTIAL/PLATELET
Absolute Monocytes: 392 cells/uL (ref 200–950)
Basophils Absolute: 20 cells/uL (ref 0–200)
Basophils Relative: 0.7 %
Eosinophils Absolute: 110 cells/uL (ref 15–500)
Eosinophils Relative: 3.8 %
HCT: 40.1 % (ref 35.0–45.0)
Hemoglobin: 12.8 g/dL (ref 11.7–15.5)
Lymphs Abs: 1531 cells/uL (ref 850–3900)
MCH: 30 pg (ref 27.0–33.0)
MCHC: 31.9 g/dL — ABNORMAL LOW (ref 32.0–36.0)
MCV: 94.1 fL (ref 80.0–100.0)
MPV: 9.6 fL (ref 7.5–12.5)
Monocytes Relative: 13.5 %
Neutro Abs: 847 cells/uL — ABNORMAL LOW (ref 1500–7800)
Neutrophils Relative %: 29.2 %
Platelets: 189 10*3/uL (ref 140–400)
RBC: 4.26 10*6/uL (ref 3.80–5.10)
RDW: 12.1 % (ref 11.0–15.0)
Total Lymphocyte: 52.8 %
WBC: 2.9 10*3/uL — ABNORMAL LOW (ref 3.8–10.8)

## 2020-05-14 LAB — LIPID PANEL
Cholesterol: 189 mg/dL (ref <200)
HDL: 62 mg/dL (ref >=50)
LDL Cholesterol (Calc): 112 mg/dL (calc) — ABNORMAL HIGH
Non-HDL Cholesterol (Calc): 127 mg/dL (calc) (ref <130)
Total CHOL/HDL Ratio: 3 (calc) (ref <5.0)
Triglycerides: 61 mg/dL (ref <150)

## 2020-05-14 LAB — COMPLETE METABOLIC PANEL WITH GFR
AG Ratio: 1.9 (calc) (ref 1.0–2.5)
ALT: 9 U/L (ref 6–29)
AST: 16 U/L (ref 10–35)
Albumin: 4.2 g/dL (ref 3.6–5.1)
Alkaline phosphatase (APISO): 34 U/L — ABNORMAL LOW (ref 37–153)
BUN: 21 mg/dL (ref 7–25)
CO2: 29 mmol/L (ref 20–32)
Calcium: 9 mg/dL (ref 8.6–10.4)
Chloride: 106 mmol/L (ref 98–110)
Creat: 0.72 mg/dL (ref 0.50–0.99)
GFR, Est African American: 104 mL/min/{1.73_m2} (ref >=60)
GFR, Est Non African American: 90 mL/min/{1.73_m2} (ref >=60)
Globulin: 2.2 g/dL (calc) (ref 1.9–3.7)
Glucose, Bld: 81 mg/dL (ref 65–99)
Potassium: 3.9 mmol/L (ref 3.5–5.3)
Sodium: 141 mmol/L (ref 135–146)
Total Bilirubin: 1.2 mg/dL (ref 0.2–1.2)
Total Protein: 6.4 g/dL (ref 6.1–8.1)

## 2020-05-14 LAB — B12 AND FOLATE PANEL
Folate: 14.5 ng/mL
Vitamin B-12: 597 pg/mL (ref 200–1100)

## 2020-05-14 LAB — VITAMIN D 25 HYDROXY (VIT D DEFICIENCY, FRACTURES): Vit D, 25-Hydroxy: 27 ng/mL — ABNORMAL LOW (ref 30–100)

## 2020-05-14 LAB — HEMOGLOBIN A1C
Hgb A1c MFr Bld: 5.6 % of total Hgb (ref <5.7)
Mean Plasma Glucose: 114 (calc)
eAG (mmol/L): 6.3 (calc)

## 2020-05-19 ENCOUNTER — Telehealth: Payer: Self-pay

## 2020-05-19 NOTE — Telephone Encounter (Signed)
Copied from CRM [id number]. Topic: General - Call Back - No Documentation >> May 19, 2020 12:07 PM Lyn Hollingshead D wrote: PT returning the call  I did not cal this patient.

## 2020-06-08 ENCOUNTER — Telehealth: Payer: Self-pay | Admitting: *Deleted

## 2020-06-08 NOTE — Telephone Encounter (Signed)
Reviewed lab results and physician's note with the patient. Answered all questions at this time.

## 2020-07-06 ENCOUNTER — Ambulatory Visit
Admission: RE | Admit: 2020-07-06 | Discharge: 2020-07-06 | Disposition: A | Discharge status: Home / Self Care | Payer: BC Managed Care – PPO | Source: Ambulatory Visit | Attending: Family Medicine | Admitting: Family Medicine

## 2020-07-06 ENCOUNTER — Other Ambulatory Visit: Payer: Self-pay

## 2020-07-06 DIAGNOSIS — Z1231 Encounter for screening mammogram for malignant neoplasm of breast: Secondary | ICD-10-CM | POA: Diagnosis not present

## 2020-08-05 ENCOUNTER — Encounter: Payer: Self-pay | Admitting: Family Medicine

## 2020-08-05 ENCOUNTER — Other Ambulatory Visit: Payer: BC Managed Care – PPO

## 2020-08-05 ENCOUNTER — Other Ambulatory Visit: Payer: Self-pay

## 2020-08-05 ENCOUNTER — Telehealth (INDEPENDENT_AMBULATORY_CARE_PROVIDER_SITE_OTHER): Payer: BC Managed Care – PPO | Admitting: Family Medicine

## 2020-08-05 VITALS — Temp 99.5°F

## 2020-08-05 DIAGNOSIS — Z03818 Encounter for observation for suspected exposure to other biological agents ruled out: Secondary | ICD-10-CM | POA: Diagnosis not present

## 2020-08-05 DIAGNOSIS — J069 Acute upper respiratory infection, unspecified: Secondary | ICD-10-CM

## 2020-08-05 DIAGNOSIS — Z1152 Encounter for screening for COVID-19: Secondary | ICD-10-CM | POA: Diagnosis not present

## 2020-08-05 NOTE — Progress Notes (Signed)
Name: Kathy Benjamin   MRN: [id number]    DOB: 1956-08-10   Date:08/05/2020       Progress Note  Subjective:    Chief Complaint  Chief Complaint  Patient presents with  . Nasal Congestion    She thinks she may have a sinus infection. She has nasal congestion, facial pain, cough with drainage. She has been treating her symptoms with Tussinex and Nyquil DM.  [name] Facial Pain  . Cough    I connected with  Kathy Benjamin  on 08/05/20 at  9:00 AM EST by a video enabled telemedicine application and verified that I am speaking with the correct person using two identifiers.  I discussed the limitations of evaluation and management by telemedicine and the availability of in person appointments. The patient expressed understanding and agreed to proceed. Staff also discussed with the patient that there may be a patient responsible charge related to this service. Patient Location:  Provider Location: [facility] Additional Individuals present: none  HPI Something in her throat and sinus drainage This week Tuesday and Wednesday she developed sx Her granddaughter had similar symptoms and was tested for Covid with molecular test Her symptoms have seemed to start to improve with over-the-counter management She does have nasal congestion, facial pain that is improving, postnasal drip which is triggering some throat clearing and cough cough is not associated with any chest pain shortness of breath wheeze, fever sweats chills or fatigue   Patient Active Problem List   Diagnosis Date Noted  . Osteopenia 07/02/2018  . Bradycardia 05/07/2018  . Leucocytosis 05/10/2017  . Urge incontinence of urine 05/06/2017  . Osteoarthritis of right hip 05/06/2017  . Post herpetic neuralgia 08/15/2015  . Bunion of left foot 05/02/2015  . H/O: hysterectomy 05/02/2015  . Edema leg 04/29/2015  . Obesity (BMI 30.0-34.9) 04/29/2015  . Primary localized osteoarthrosis, lower leg 04/29/2015  . Perennial  allergic rhinitis with seasonal variation 04/29/2015  . Borderline diabetes 04/29/2015  . Menopausal symptom 04/29/2015  . Vitamin D deficiency 04/29/2015  . Abnormal electrocardiogram 04/02/2007    Social History   Tobacco Use  . Smoking status: Never Smoker  . Smokeless tobacco: Never Used  Substance Use Topics  . Alcohol use: No    Alcohol/week: 0.0 standard drinks     Current Outpatient Medications:  .  acetaminophen (TYLENOL) 500 MG tablet, Take 1 tablet (500 mg total) by mouth every 6 (six) hours as needed., Disp: 500 tablet, Rfl: 0 .  Cholecalciferol (VITAMIN D) 2000 units CAPS, Take 1 capsule by mouth daily., Disp: , Rfl:  .  diclofenac Sodium (VOLTAREN) 1 % GEL, Apply 4 g topically 4 (four) times daily., Disp: 100 g, Rfl: 1 .  fluticasone (FLONASE) 50 MCG/ACT nasal spray, SPRAY 2 SPRAYS INTO EACH NOSTRIL EVERY DAY, Disp: 16 mL, Rfl: 4 .  loratadine (CLARITIN) 10 MG tablet, TAKE 1 TABLET BY MOUTH TWICE A DAY, Disp: 60 tablet, Rfl: 1  No Known Allergies  I personally reviewed active problem list, medication list, allergies, family history, social history, health maintenance, notes from last encounter, lab results, imaging with the patient/caregiver today.   Review of Systems  10 Systems reviewed and are negative for acute change except as noted in the HPI.   Objective:   Virtual encounter, vitals limited, only able to obtain the following Today's Vitals   08/05/20 0901  Temp: 99.5 F (37.5 C)  TempSrc: Oral   There is no height or weight on file to  calculate BMI. Nursing Note and Vital Signs reviewed.  Physical Exam Normal phonation, patient alert, answering questions appropriately, no audible wheeze, stridor or respiratory distress PE limited by telephone encounter  No results found for this or any previous visit (from the past 72 hour(s)).  Assessment and Plan:     ICD-10-CM   1. Upper respiratory tract infection, unspecified type  J06.9   Symptoms for  few days seem to be gradually improving, sick contacts include her granddaughter who is currently pending her test for Covid no other sick contacts, continue conservative management treating symptoms and supportive measures, patient encouraged to follow-up with [place] regarding her daughter-in-law's Covid test and please to get tested herself Likely allergy versus other viral upper respiratory infection which will usually run its course I did review with her signs and symptoms of acute bacterial sinusitis which antibiotics are indicated for and I do not feel that she currently requires antibiotics due to improving symptoms, lack of fever and duration of illness  -Red flags and when to present for emergency care or RTC including chest pain, shortness of breath, new/worsening/un-resolving symptoms, reviewed with patient at time of visit. Follow up and care instructions discussed and provided in AVS. - I discussed the assessment and treatment plan with the patient. The patient was provided an opportunity to ask questions and all were answered. The patient agreed with the plan and demonstrated an understanding of the instructions.  I provided 20+ minutes of non-face-to-face time during this encounter.  Danelle Berry, PA-C 08/05/20 9:09 AM

## 2020-08-06 ENCOUNTER — Other Ambulatory Visit: Payer: Self-pay | Admitting: Family Medicine

## 2020-08-09 ENCOUNTER — Other Ambulatory Visit: Payer: Self-pay

## 2020-08-09 ENCOUNTER — Encounter: Payer: Self-pay | Admitting: Family Medicine

## 2020-08-09 ENCOUNTER — Telehealth (INDEPENDENT_AMBULATORY_CARE_PROVIDER_SITE_OTHER): Payer: BC Managed Care – PPO | Admitting: Family Medicine

## 2020-08-09 VITALS — Temp 96.1°F

## 2020-08-09 DIAGNOSIS — J069 Acute upper respiratory infection, unspecified: Secondary | ICD-10-CM | POA: Diagnosis not present

## 2020-08-09 DIAGNOSIS — J329 Chronic sinusitis, unspecified: Secondary | ICD-10-CM | POA: Diagnosis not present

## 2020-08-09 DIAGNOSIS — J310 Chronic rhinitis: Secondary | ICD-10-CM

## 2020-08-09 MED ORDER — BENZONATATE 100 MG PO CAPS: 100.0000 mg | ORAL_CAPSULE | Freq: Three times a day (TID) | ORAL | 0 refills | Status: DC | PRN

## 2020-08-09 MED ORDER — AMOXICILLIN-POT CLAVULANATE 875-125 MG PO TABS: 1.0000 | ORAL_TABLET | Freq: Two times a day (BID) | ORAL | 0 refills | Status: AC

## 2020-08-09 NOTE — Progress Notes (Signed)
Name: Kathy Benjamin   MRN: [id number]    DOB: Nov 12, 1955   Date:08/09/2020       Progress Note  Subjective:    Chief Complaint  Chief Complaint  Patient presents with  . Sinusitis    cough, congested for 1 week    I connected with  Kathy Benjamin on 08/09/20 at  9:40 AM EST by telephone and verified that I am speaking with the correct person using two identifiers.   I discussed the limitations, risks, security and privacy concerns of performing an evaluation and management service by telephone and the availability of in person appointments. Staff also discussed with the patient that there may be a patient responsible charge related to this service.  Patient verbalized understanding and agreed to proceed with encounter. Patient Location: work Restaurant manager, fast food Location: [facility] Additional Individuals present: none  HPI URI sx onset last week, did virtual visit Friday, sx mild at that time, exposure to granddaughter who was sick with neg COVID test, she presents with worsening sx. She did get tested and was COVID neg  She continues to endorse some nasal congestion and nasal discharge with mild productive cough mostly feels like she is clearing her throat, cough is worse at night but overall she has tried Mucinex and her congestion has slightly improved She has stopped her other allergy medications She denies any severe facial or sinus pain or pressure, fever, headaches, chest pain, shortness of breath, wheeze, fatigue, hot cold chills or sweats   Patient Active Problem List   Diagnosis Date Noted  . Osteopenia 07/02/2018  . Bradycardia 05/07/2018  . Leucocytosis 05/10/2017  . Urge incontinence of urine 05/06/2017  . Osteoarthritis of right hip 05/06/2017  . Post herpetic neuralgia 08/15/2015  . Bunion of left foot 05/02/2015  . H/O: hysterectomy 05/02/2015  . Edema leg 04/29/2015  . Obesity (BMI 30.0-34.9) 04/29/2015  . Primary localized osteoarthrosis, lower leg  04/29/2015  . Perennial allergic rhinitis with seasonal variation 04/29/2015  . Borderline diabetes 04/29/2015  . Menopausal symptom 04/29/2015  . Vitamin D deficiency 04/29/2015  . Abnormal electrocardiogram 04/02/2007    Social History   Tobacco Use  . Smoking status: Never Smoker  . Smokeless tobacco: Never Used  Substance Use Topics  . Alcohol use: No    Alcohol/week: 0.0 standard drinks     Current Outpatient Medications:  .  acetaminophen (TYLENOL) 500 MG tablet, Take 1 tablet (500 mg total) by mouth every 6 (six) hours as needed., Disp: 500 tablet, Rfl: 0 .  Cholecalciferol (VITAMIN D) 2000 units CAPS, Take 1 capsule by mouth daily., Disp: , Rfl:  .  diclofenac Sodium (VOLTAREN) 1 % GEL, Apply 4 g topically 4 (four) times daily., Disp: 100 g, Rfl: 1 .  fluticasone (FLONASE) 50 MCG/ACT nasal spray, SPRAY 2 SPRAYS INTO EACH NOSTRIL EVERY DAY, Disp: 16 mL, Rfl: 4 .  loratadine (CLARITIN) 10 MG tablet, TAKE 1 TABLET BY MOUTH TWICE A DAY, Disp: 60 tablet, Rfl: 2  No Known Allergies  Chart Review: I personally reviewed active problem list, medication list, allergies, family history, social history, health maintenance, notes from last encounter, lab results, imaging with the patient/caregiver today.   Review of Systems 10 Systems reviewed and are negative for acute change except as noted in the HPI.   Objective:    Virtual encounter, vitals limited, only able to obtain the following Today's Vitals   08/09/20 0914  Temp: (!) 96.1 F (35.6 C)  TempSrc: Oral  There is no height or weight on file to calculate BMI. Nursing Note and Vital Signs reviewed.  Physical Exam Vitals and nursing note reviewed.  Neck:     Comments: Phonation slightly scratchy Pulmonary:     Comments: Able to speak in full and complete sentences, no audible wheeze or stridor, no coughing throughout exam Neurological:     Mental Status: She is alert.     PE limited by telephone  encounter  No results found for this or any previous visit (from the past 72 hour(s)).  Assessment and Plan:     ICD-10-CM   1. Upper respiratory tract infection, unspecified type  J06.9 benzonatate (TESSALON) 100 MG capsule   1 week of sx, no acute worsening, covid neg, some improvement with OTC meds, continue supportive/sx tx  2. Rhinosinusitis  J31.0 amoxicillin-clavulanate (AUGMENTIN) 875-125 MG tablet   J32.9    virtual encounter, still suspect allergic vs viral, may run its course with conservative management, reassured pt sx may last few weeks  Encourage patient to continue over-the-counter medications but also do not stop basic management of her nasal allergies She has had some improvement with Mucinex.  She is mostly concerned about a scratchy voice although she is overall feeling better.  She multiple times asked for antibiotics although I did explain to her that they are not likely to improve her scratchy throat or mild cough the symptoms commonly linger for several days to a few weeks and as long as they are gradually improving are very normal with either worsening seasonal allergies or a viral URI. Encouraged her to try over-the-counter cough medications and only if she has acute worsening of her nasal symptoms or severe facial or sinus pain and tenderness or any new fever, or continuation of symptoms beyond 10 to 12 days (beyond this next Friday) with the antibiotics be indicated I did explain to her that at this point I do think she would have more side effects than benefit from antibiotics and she should continue conservative management  -Red flags and when to present for emergency care or RTC including but not limited to new/worsening/un-resolving symptoms,  reviewed with patient at time of visit. Follow up and care instructions discussed and provided in AVS. - I discussed the assessment and treatment plan with the patient. The patient was provided an opportunity to ask questions  and all were answered. The patient agreed with the plan and demonstrated an understanding of the instructions.  - The patient was advised to call back or seek an in-person evaluation if the symptoms worsen or if the condition fails to improve as anticipated.  I provided 20+ minutes of non-face-to-face time during this encounter.  Danelle Berry, PA-C 08/09/20 9:53 AM

## 2020-08-22 IMAGING — MG DIGITAL SCREENING BILAT W/ TOMO W/ CAD
6 of 10 series · 6 of 30 positions shown · non-contrast
Comparison: Previous exam(s).

CLINICAL DATA: Screening.

EXAM:
DIGITAL SCREENING BILATERAL MAMMOGRAM WITH TOMO AND CAD

[R MLO synth-2D]
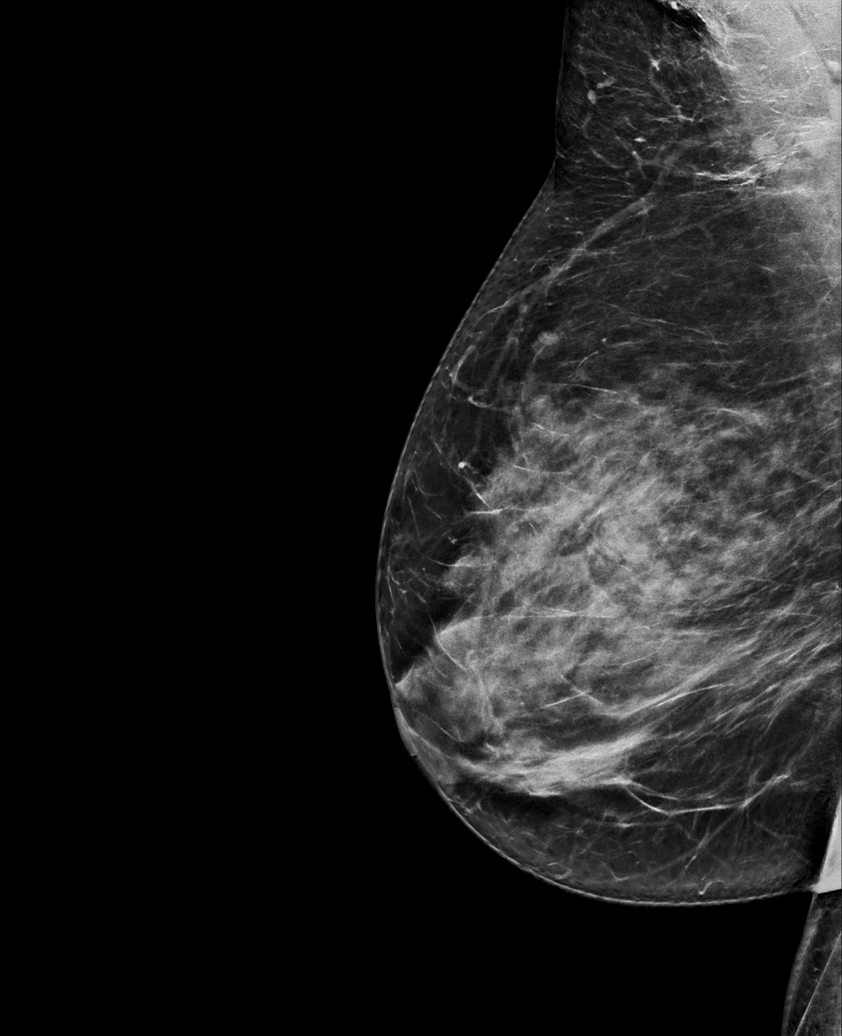

[L MLO synth-2D (1 of 2)]
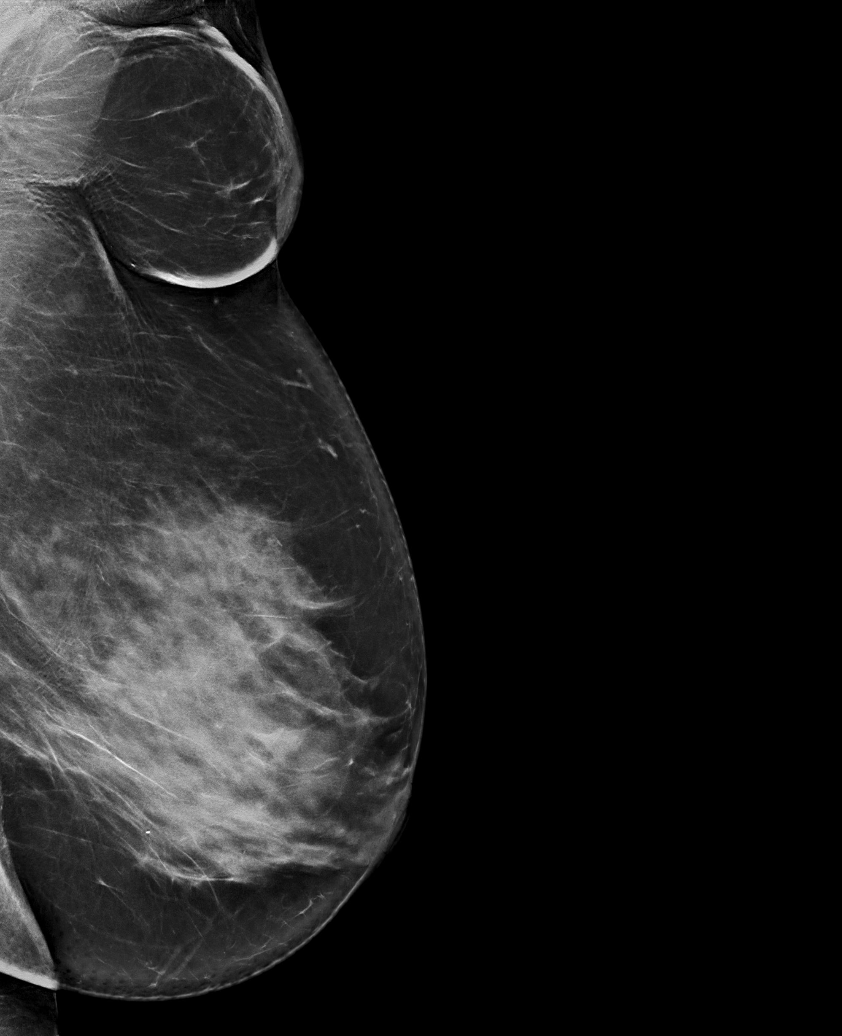

[R CC synth-2D]
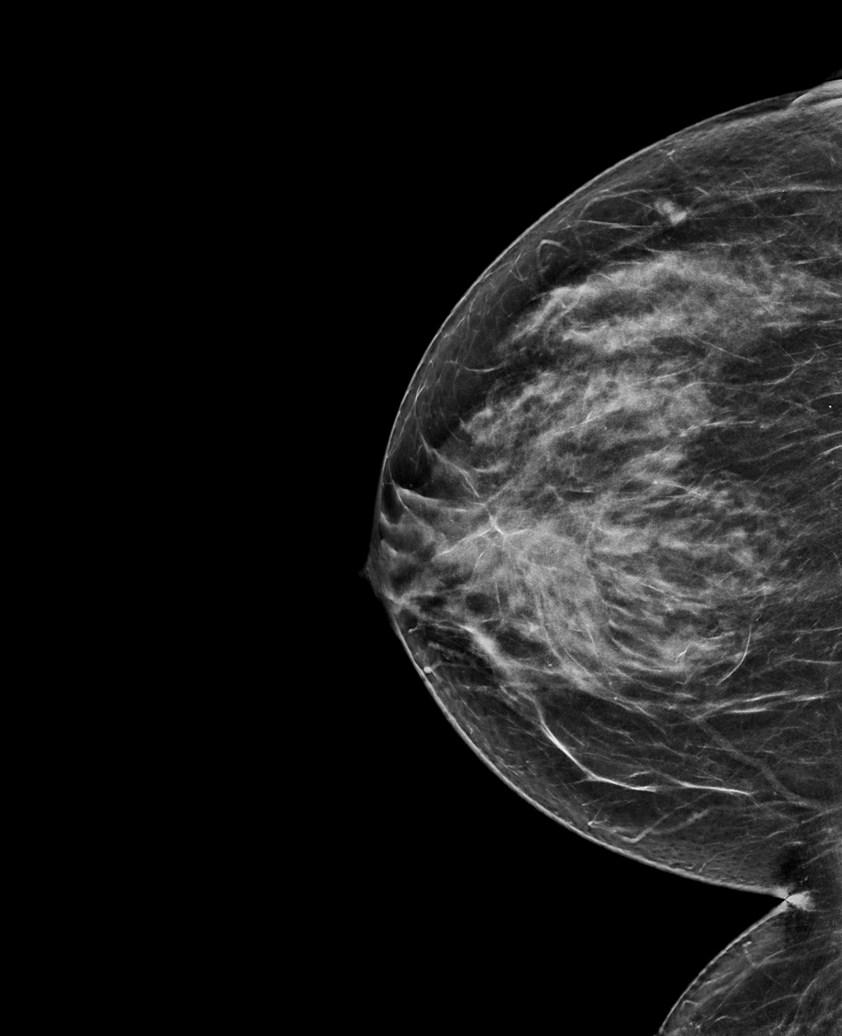

[L MLO synth-2D (2 of 2)]
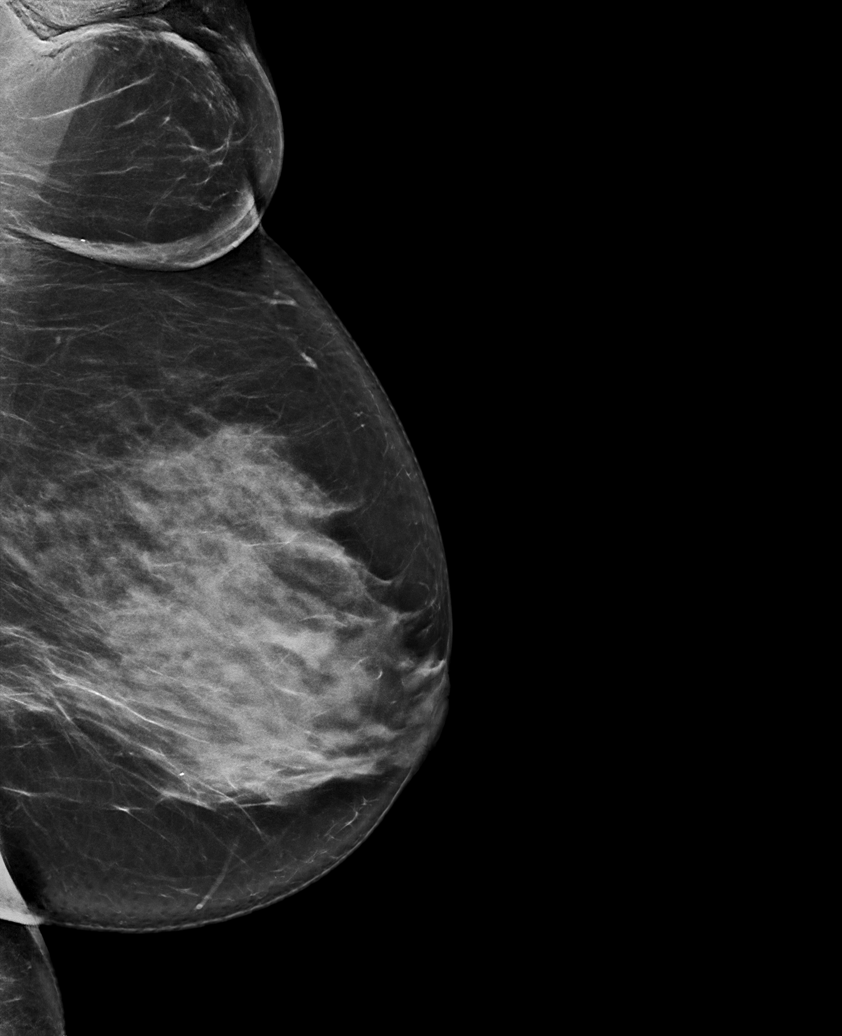

[L CC synth-2D]
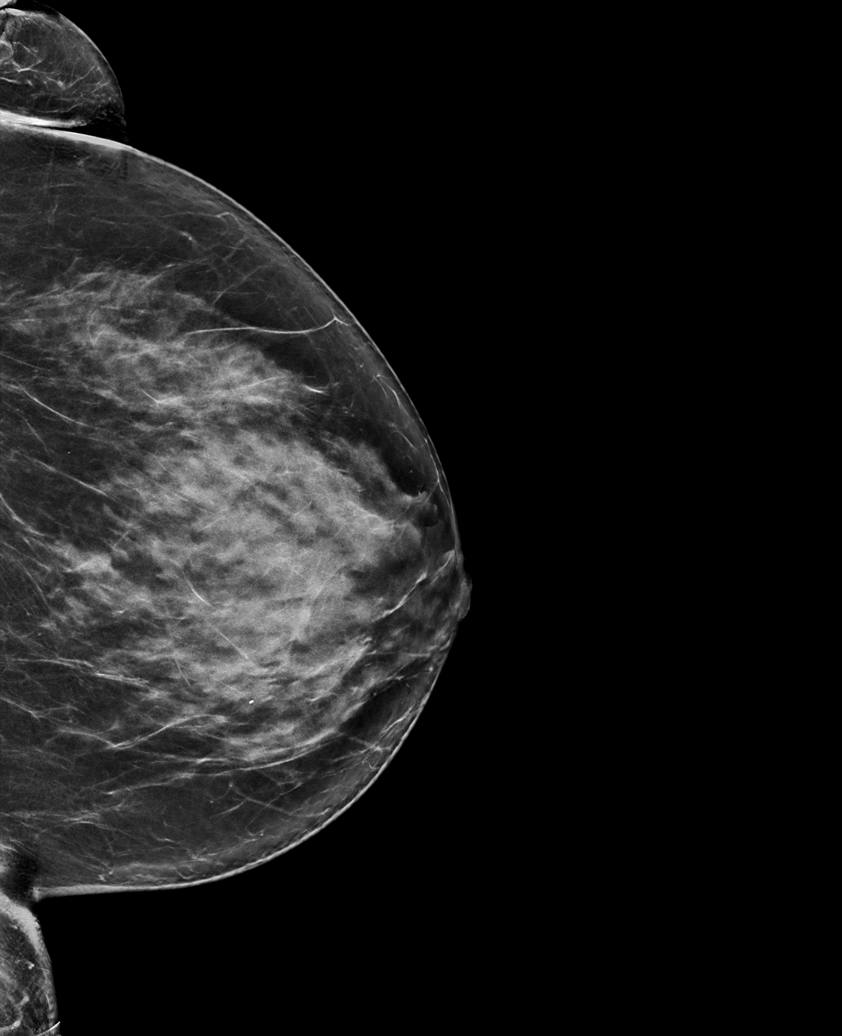

[R CC tomo · tomo slice 35/69.0]
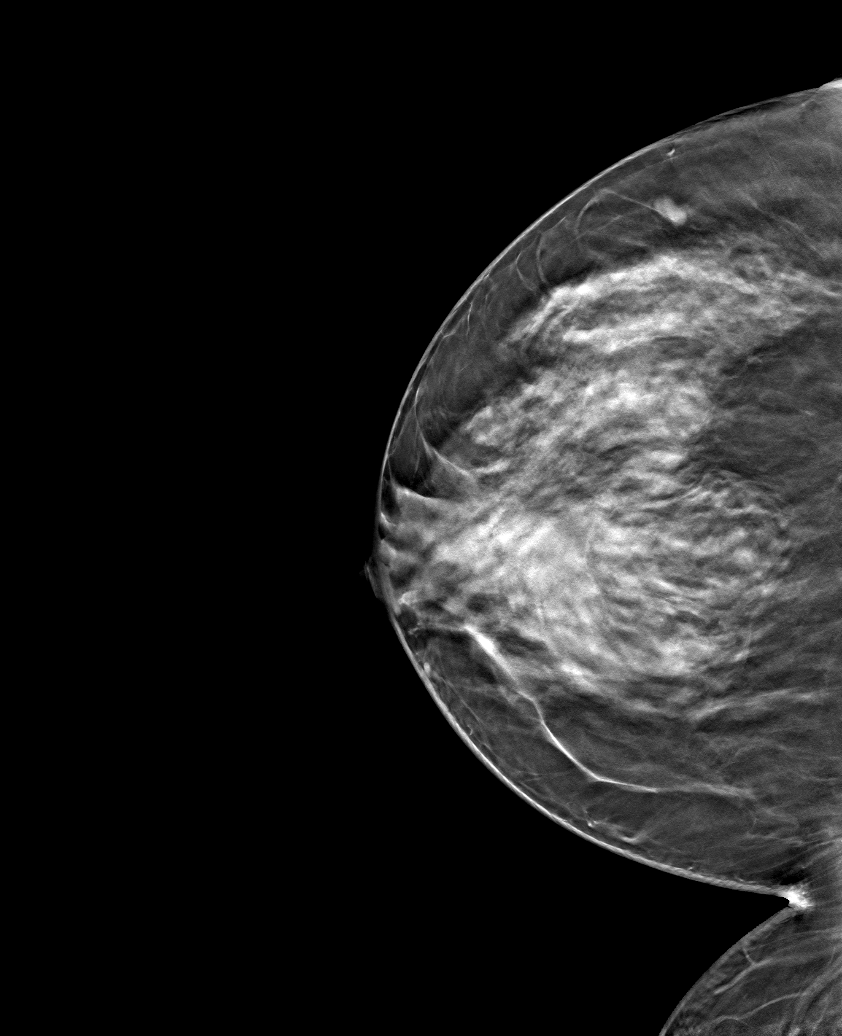

[6 of 30 positions shown; findings below may reference images not displayed]

ACR Breast Density Category c: The breast tissue is heterogeneously
dense, which may obscure small masses.
FINDINGS: There are no findings suspicious for malignancy. Images were
processed with CAD.
IMPRESSION: No mammographic evidence of malignancy. A result letter of this
screening mammogram will be mailed directly to the patient.

RECOMMENDATION:
Screening mammogram in one year. (Code:[id number])

BI-RADS CATEGORY  1: Negative.

## 2020-11-10 NOTE — Progress Notes (Signed)
Name: Kathy Benjamin   MRN: [id number]    DOB: 11-27-1955   Date:11/14/2020       Progress Note  Subjective  Chief Complaint  Follow up   HPI   Leucopenia: stable, last two levels at 2.9. No anemia and normal platelets, likely physiological and we will monitor it yearly   Pre-diabetes: her A1C went from  5.6% to 5.8% and last visit was down to 5.5% and last visit was 5.6 %   She denies polyphagia, polydipsia or polyuria, discussed importance of changing her diet.   Left wrist pain: she was at work and pulled a tote off the belt and developed pain and swelling on dorsal aspect of left wrist, she has been wearing brace and took some Tylenol, she is also applying rubbing alcohol and swelling and pain is down, it happened a few weeks ago. She did not tell her supervisor.   OA left knee: using topical voltaren gel and seems to help with the pain, it is causing a little rash on left knee but she uses a steroid cream prn and it controls symptoms She has effusion intermittently. Pain is dull and mild, states under control.   Vitamin D deficiency: continue vitamin D  2000 units daily . B12 deficiency , resume supplementation   Dyslipidemia:   The 10-year ASCVD risk score Denman George DC Jr., et al., 2013) is: 6.3%   Values used to calculate the score:     Age: 65 years     Sex: Female     Is Non-Hispanic African American: Yes     Diabetic: No     Tobacco smoker: No     Systolic Blood Pressure: 130 mmHg     Is BP treated: No     HDL Cholesterol: 62 mg/dL     Total Cholesterol: 189 mg/dL  Patient Active Problem List   Diagnosis Date Noted  . Osteopenia 07/02/2018  . Bradycardia 05/07/2018  . Leucocytosis 05/10/2017  . Urge incontinence of urine 05/06/2017  . Osteoarthritis of right hip 05/06/2017  . Post herpetic neuralgia 08/15/2015  . Bunion of left foot 05/02/2015  . H/O: hysterectomy 05/02/2015  . Edema leg 04/29/2015  . Obesity (BMI 30.0-34.9) 04/29/2015  . Primary localized  osteoarthrosis, lower leg 04/29/2015  . Perennial allergic rhinitis with seasonal variation 04/29/2015  . Borderline diabetes 04/29/2015  . Menopausal symptom 04/29/2015  . Vitamin D deficiency 04/29/2015  . Abnormal electrocardiogram 04/02/2007    Past Surgical History:  Procedure Laterality Date  . ABDOMINAL HYSTERECTOMY    . BREAST EXCISIONAL BIOPSY Right 2009   neg  . BREAST SURGERY Right [id number]   biopsy  . COLONOSCOPY    . COLONOSCOPY WITH PROPOFOL N/A 08/05/2015   Procedure: COLONOSCOPY WITH PROPOFOL;  Surgeon: Midge Minium, MD;  [facility];  Service: Endoscopy;  Laterality: N/A;  . HERNIA REPAIR     umbilical    Family History  Problem Relation Age of Onset  . Diabetes Mother   . Hypertension Mother   . Stroke Father   . Hypertension Father   . Cancer Father   . Hyperthyroidism Sister   . Cancer Brother        prostate  . Hyperthyroidism Brother   . Kidney disease Sister   . Breast cancer Neg Hx     Social History   Tobacco Use  . Smoking status: Never Smoker  . Smokeless tobacco: Never Used  Substance Use Topics  . Alcohol use: No  Alcohol/week: 0.0 standard drinks     Current Outpatient Medications:  .  acetaminophen (TYLENOL) 500 MG tablet, Take 1 tablet (500 mg total) by mouth every 6 (six) hours as needed., Disp: 500 tablet, Rfl: 0 .  Cholecalciferol (VITAMIN D) 2000 units CAPS, Take 1 capsule by mouth daily., Disp: , Rfl:  .  diclofenac Sodium (VOLTAREN) 1 % GEL, Apply 4 g topically 4 (four) times daily., Disp: 100 g, Rfl: 1 .  fluticasone (FLONASE) 50 MCG/ACT nasal spray, SPRAY 2 SPRAYS INTO EACH NOSTRIL EVERY DAY, Disp: 16 mL, Rfl: 4 .  loratadine (CLARITIN) 10 MG tablet, TAKE 1 TABLET BY MOUTH TWICE A DAY, Disp: 60 tablet, Rfl: 2 .  mometasone (ELOCON) 0.1 % lotion, SMARTSIG:4 Drop(s) In Ear(s) Every Night PRN, Disp: , Rfl:   No Known Allergies  I personally reviewed active problem list, medication list, allergies, family  history, social history, health maintenance with the patient/caregiver today.   ROS  Constitutional: Negative for fever or weight change.  Respiratory: Negative for cough and shortness of breath.   Cardiovascular: Negative for chest pain or palpitations.  Gastrointestinal: Negative for abdominal pain, no bowel changes.  Musculoskeletal: Negative for gait problem or joint swelling.  Skin: Negative for rash.  Neurological: Negative for dizziness or headache.  No other specific complaints in a complete review of systems (except as listed in HPI above).  Objective  Vitals:   11/14/20 0920  BP: 130/70  Pulse: 88  Resp: 16  Temp: 98.1 F (36.7 C)  TempSrc: Oral  SpO2: 98%  Weight: 194 lb 12.8 oz (88.4 kg)  Height: 5\' 4"  (1.626 m)    Body mass index is 33.44 kg/m.  Physical Exam  Constitutional: Patient appears well-developed and well-nourished. Obese  No distress.  HEENT: head atraumatic, normocephalic, pupils equal and reactive to light, neck supple Cardiovascular: Normal rate, regular rhythm and normal heart sounds.  No murmur heard. No BLE edema. Pulmonary/Chest: Effort normal and breath sounds normal. No respiratory distress. Abdominal: Soft.  There is no tenderness. Muscular skeletal: crepitus during extension of left knee, no redness or increase in warmth , she has mild effusion  Psychiatric: Patient has a normal mood and affect. behavior is normal. Judgment and thought content normal.   PHQ2/9: Depression screen [facility] 2/9 11/14/2020 08/09/2020 08/05/2020 05/13/2020 05/13/2020  Decreased Interest 0 0 0 0 0  Down, Depressed, Hopeless 0 0 0 0 0  PHQ - 2 Score 0 0 0 0 0  Altered sleeping - - - 0 0  Tired, decreased energy - - - 0 0  Change in appetite - - - 0 0  Feeling bad or failure about yourself  - - - 0 0  Trouble concentrating - - - 0 0  Moving slowly or fidgety/restless - - - 0 0  Suicidal thoughts - - - 0 0  PHQ-9 Score - - - 0 0  Difficult doing work/chores -  - - - -  Some recent data might be hidden    phq 9 is negative   Fall Risk: Fall Risk  11/14/2020 08/09/2020 08/05/2020 05/13/2020 11/04/2019  Falls in the past year? 0 0 0 0 0  Number falls in past yr: 0 0 0 0 0  Comment - - - - -  Injury with Fall? 0 0 0 0 0  Comment - - - - -  Follow up - Falls evaluation completed - - -     Functional Status Survey: Is the patient deaf or  have difficulty hearing?: No Does the patient have difficulty seeing, even when wearing glasses/contacts?: No Does the patient have difficulty concentrating, remembering, or making decisions?: No Does the patient have difficulty walking or climbing stairs?: No Does the patient have difficulty dressing or bathing?: No Does the patient have difficulty doing errands alone such as visiting a doctor's office or shopping?: No   Assessment & Plan  1. Vitamin D deficiency  Continue supplementation   2. Pre-diabetes  Discussed life style modification   3. B12 deficiency  Continue supplementation   4. Leukopenia, unspecified type  Recheck next visit   5. Bilateral lower extremity edema  Lymphedema stable   6. Primary osteoarthritis of left knee  - acetaminophen (TYLENOL) 500 MG tablet; Take 1 tablet (500 mg total) by mouth every 6 (six) hours as needed.  Dispense: 90 tablet; Refill: 1  7. Left wrist pain  Reassurance given   8. Effusion of left knee  - acetaminophen (TYLENOL) 500 MG tablet; Take 1 tablet (500 mg total) by mouth every 6 (six) hours as needed.  Dispense: 90 tablet; Refill: 1

## 2020-11-14 ENCOUNTER — Other Ambulatory Visit: Payer: Self-pay

## 2020-11-14 ENCOUNTER — Encounter: Payer: Self-pay | Admitting: Family Medicine

## 2020-11-14 ENCOUNTER — Ambulatory Visit (INDEPENDENT_AMBULATORY_CARE_PROVIDER_SITE_OTHER): Payer: BC Managed Care – PPO | Admitting: Family Medicine

## 2020-11-14 VITALS — BP 130/70 | HR 88 | Temp 98.1°F | Resp 16 | Ht 64.0 in | Wt 194.8 lb

## 2020-11-14 DIAGNOSIS — M25532 Pain in left wrist: Secondary | ICD-10-CM

## 2020-11-14 DIAGNOSIS — M1712 Unilateral primary osteoarthritis, left knee: Secondary | ICD-10-CM

## 2020-11-14 DIAGNOSIS — E559 Vitamin D deficiency, unspecified: Secondary | ICD-10-CM | POA: Diagnosis not present

## 2020-11-14 DIAGNOSIS — D72819 Decreased white blood cell count, unspecified: Secondary | ICD-10-CM

## 2020-11-14 DIAGNOSIS — E538 Deficiency of other specified B group vitamins: Secondary | ICD-10-CM | POA: Diagnosis not present

## 2020-11-14 DIAGNOSIS — R600 Localized edema: Secondary | ICD-10-CM

## 2020-11-14 DIAGNOSIS — M25462 Effusion, left knee: Secondary | ICD-10-CM

## 2020-11-14 DIAGNOSIS — R7303 Prediabetes: Secondary | ICD-10-CM

## 2020-11-14 MED ORDER — ACETAMINOPHEN 500 MG PO TABS: 500.0000 mg | ORAL_TABLET | Freq: Four times a day (QID) | ORAL | 1 refills | Status: AC | PRN

## 2021-05-11 ENCOUNTER — Other Ambulatory Visit: Payer: Self-pay | Admitting: Family Medicine

## 2021-05-11 NOTE — Telephone Encounter (Signed)
Requested Prescriptions  Pending Prescriptions Disp Refills  . loratadine (CLARITIN) 10 MG tablet [Pharmacy Med Name: LORATADINE 10 MG TABLET] 90 tablet 0    Sig: TAKE 1 TABLET BY MOUTH TWICE A DAY     Ear, Nose, and Throat:  Antihistamines Passed - 05/11/2021  1:26 AM      Passed - Valid encounter within last 12 months    Recent Outpatient Visits          5 months ago Vitamin D deficiency   [facility] Alba Cory, MD   9 months ago Upper respiratory tract infection, unspecified type   [facility] Danelle Berry, PA-C   9 months ago Upper respiratory tract infection, unspecified type   [facility] Danelle Berry, PA-C   12 months ago Well adult exam   [facility] Alba Cory, MD   1 year ago Leukopenia, unspecified type   [facility] Alba Cory, MD      Future Appointments            In 4 days Alba Cory, MD [address], [facility]

## 2021-05-15 ENCOUNTER — Other Ambulatory Visit: Payer: Self-pay

## 2021-05-15 ENCOUNTER — Encounter: Payer: Self-pay | Admitting: Family Medicine

## 2021-05-15 ENCOUNTER — Ambulatory Visit (INDEPENDENT_AMBULATORY_CARE_PROVIDER_SITE_OTHER): Payer: BC Managed Care – PPO | Admitting: Family Medicine

## 2021-05-15 VITALS — BP 120/76 | HR 70 | Temp 98.2°F | Resp 16 | Ht 64.0 in | Wt 191.0 lb

## 2021-05-15 DIAGNOSIS — E559 Vitamin D deficiency, unspecified: Secondary | ICD-10-CM

## 2021-05-15 DIAGNOSIS — Z23 Encounter for immunization: Secondary | ICD-10-CM

## 2021-05-15 DIAGNOSIS — Z0000 Encounter for general adult medical examination without abnormal findings: Secondary | ICD-10-CM

## 2021-05-15 DIAGNOSIS — R7303 Prediabetes: Secondary | ICD-10-CM

## 2021-05-15 DIAGNOSIS — E785 Hyperlipidemia, unspecified: Secondary | ICD-10-CM

## 2021-05-15 DIAGNOSIS — Z1231 Encounter for screening mammogram for malignant neoplasm of breast: Secondary | ICD-10-CM

## 2021-05-15 DIAGNOSIS — E538 Deficiency of other specified B group vitamins: Secondary | ICD-10-CM

## 2021-05-15 DIAGNOSIS — D72819 Decreased white blood cell count, unspecified: Secondary | ICD-10-CM

## 2021-05-15 DIAGNOSIS — M1712 Unilateral primary osteoarthritis, left knee: Secondary | ICD-10-CM | POA: Diagnosis not present

## 2021-05-15 NOTE — Progress Notes (Signed)
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 [facility]  AUDIT-C Score 0      Depression: Phq 9 is  negative Depression screen [facility] 2/9 05/15/2021 11/14/2020 08/09/2020 08/05/2020 05/13/2020  Decreased Interest 0 0 0 0 0  Down, Depressed, Hopeless 0 0 0 0 0  PHQ - 2 Score 0 0 0 0 0  Altered sleeping - - - - 0  Tired, decreased energy - - - - 0  Change in appetite - - - - 0  Feeling bad or failure about yourself  - - - - 0  Trouble concentrating - - - - 0  Moving slowly or fidgety/restless - - - - 0  Suicidal thoughts - - - - 0  PHQ-9 Score - - - - 0  Difficult doing work/chores - - - - -  Some recent data might be hidden   Hypertension: BP Readings from Last 3 Encounters:  05/15/21 120/76  11/14/20 130/70  05/13/20 100/70   Obesity: Wt Readings from Last 3 Encounters:  05/15/21 191 lb (86.6 kg)  11/14/20 194 lb 12.8 oz (88.4 kg)  05/13/20 191 lb 12.8 oz (87 kg)   BMI Readings from Last 3 Encounters:  05/15/21 32.79 kg/m  11/14/20 33.44 kg/m  05/13/20 32.92 kg/m     Vaccines:   Pneumonia: educated and discussed with patient. Flu: she will get in in October at work   Hep C Screening: 04/16/13 STD testing and prevention (HIV/chl/gon/syphilis): 05/02/15 Intimate partner violence:negative Sexual History : not sexually active  Menstrual History/LMP/Abnormal Bleeding: discussed importance of follow up if any post-menopausal  Incontinence Symptoms: no problems   Breast cancer:  - Last Mammogram: 07/06/20 - BRCA gene screening: N/A  Osteoporosis: Discussed high calcium and vitamin D supplementation, weight bearing exercises  Cervical cancer screening: N/A -s/p hysterectomy   Skin cancer:  Discussed monitoring for atypical lesions  Colorectal cancer: 08/05/15   Lung cancer: Low Dose CT Chest recommended if Age 64-80 years, 20 pack-year currently smoking OR have quit w/in 15years. Patient does not qualify.   ECG: 05/07/18  Advanced Care Planning: A voluntary discussion about advance care planning including the explanation and discussion of advance directives.  Discussed health care proxy and Living will, and the patient was able to identify a health care proxy as  sister - [name]   Lipids: Lab Results  Component Value Date   CHOL 189 05/13/2020   CHOL 185 05/11/2019   CHOL 173 05/07/2018   Lab Results  Component Value Date   HDL 62 05/13/2020   HDL 57 05/11/2019   HDL 59 05/07/2018   Lab Results  Component Value Date   LDLCALC 112 (H) 05/13/2020   LDLCALC 111 (H) 05/11/2019   LDLCALC 101 (H) 05/07/2018   Lab Results  Component Value Date   TRIG 61 05/13/2020   TRIG 79 05/11/2019   TRIG 50 05/07/2018   Lab Results  Component Value Date   CHOLHDL 3.0 05/13/2020   CHOLHDL 3.2 05/11/2019   CHOLHDL 2.9 05/07/2018   No results found for: LDLDIRECT  Glucose: Glucose, Bld  Date Value Ref Range Status  05/13/2020 81 65 - 99 mg/dL  Final    Comment:    .            Fasting reference interval .   05/11/2019 81 65 - 99 mg/dL Final    Comment:    .            Fasting reference interval .   12/31/2018 83 65 - 99 mg/dL Final    Comment:    .            Fasting reference interval .     Patient Active Problem List   Diagnosis Date Noted   Osteopenia 07/02/2018   Bradycardia 05/07/2018   Leucocytosis 05/10/2017   Urge incontinence of urine 05/06/2017   Osteoarthritis of right hip 05/06/2017   Post herpetic neuralgia 08/15/2015   Bunion of left foot 05/02/2015   H/O: hysterectomy 05/02/2015   Edema leg 04/29/2015   Obesity (BMI 30.0-34.9) 04/29/2015   Primary localized osteoarthrosis, lower leg 04/29/2015   Perennial allergic rhinitis with  seasonal variation 04/29/2015   Borderline diabetes 04/29/2015   Menopausal symptom 04/29/2015   Vitamin D deficiency 04/29/2015   Abnormal electrocardiogram 04/02/2007    Past Surgical History:  Procedure Laterality Date   ABDOMINAL HYSTERECTOMY     BREAST EXCISIONAL BIOPSY Right 2009   neg   BREAST SURGERY Right [id number]   biopsy   COLONOSCOPY     COLONOSCOPY WITH PROPOFOL N/A 08/05/2015   Procedure: COLONOSCOPY WITH PROPOFOL;  Surgeon: Lucilla Lame, MD;  Location: [place];  Service: Endoscopy;  Laterality: N/A;   HERNIA REPAIR     umbilical    Family History  Problem Relation Age of Onset   Diabetes Mother    Hypertension Mother    Stroke Father    Hypertension Father    Cancer Father    Hyperthyroidism Sister    Cancer Brother        prostate   Hyperthyroidism Brother    Kidney disease Sister    Breast cancer Neg Hx     Social History   Socioeconomic History   Marital status: Single    Spouse name: Not on file   Number of children: 0   Years of education: Not on file   Highest education level: High school graduate  Occupational History   Not on file  Tobacco Use   Smoking status: Never   Smokeless tobacco: Never  Vaping Use   Vaping Use: Never used  Substance and Sexual Activity   Alcohol use: No    Alcohol/week: 0.0 standard drinks   Drug use: No   Sexual activity: Not Currently  Other Topics Concern   Not on file  Social History Narrative   Not working since March 31 st 2020 because of COVID-19 may be re-hired after the Summer    Social Determinants of Radio broadcast assistant Strain: Low Risk    Difficulty of Paying Living Expenses: Not hard at all  Food Insecurity: No Food Insecurity   Worried About Charity fundraiser in the Last Year: Never true   Arboriculturist in the Last Year: Never true  Transportation Needs: No Transportation Needs   Lack of Transportation (Medical): No   Lack of Transportation (Non-Medical): No   Physical Activity: Insufficiently Active   Days of Exercise per Week: 1 day   Minutes of Exercise per Session: 20 min  Stress: No Stress Concern Present   Feeling of Stress : Only a little  Social Connections: Moderately Integrated  Frequency of Communication with Friends and Family: More than three times a week   Frequency of Social Gatherings with Friends and Family: Twice a week   Attends Religious Services: More than 4 times per year   Active Member of [employer] or Organizations: Yes   Attends Archivist Meetings: 1 to 4 times per year   Marital Status: Never married  Human resources officer Violence: Not At Risk   Fear of Current or Ex-Partner: No   Emotionally Abused: No   Physically Abused: No   Sexually Abused: No     Current Outpatient Medications:    acetaminophen (TYLENOL) 500 MG tablet, Take 1 tablet (500 mg total) by mouth every 6 (six) hours as needed., Disp: 90 tablet, Rfl: 1   Cholecalciferol (VITAMIN D) 2000 units CAPS, Take 1 capsule by mouth daily., Disp: , Rfl:    diclofenac Sodium (VOLTAREN) 1 % GEL, Apply 4 g topically 4 (four) times daily., Disp: 100 g, Rfl: 1   fluticasone (FLONASE) 50 MCG/ACT nasal spray, SPRAY 2 SPRAYS INTO EACH NOSTRIL EVERY DAY, Disp: 16 mL, Rfl: 4   loratadine (CLARITIN) 10 MG tablet, TAKE 1 TABLET BY MOUTH TWICE A DAY, Disp: 90 tablet, Rfl: 0   mometasone (ELOCON) 0.1 % lotion, SMARTSIG:4 Drop(s) In Ear(s) Every Night PRN, Disp: , Rfl:   No Known Allergies   ROS  Constitutional: Negative for fever or weight change.  Respiratory: Negative for cough and shortness of breath.   Cardiovascular: Negative for chest pain or palpitations.  Gastrointestinal: Negative for abdominal pain, no bowel changes.  Musculoskeletal: Negative for gait problem or joint swelling.  Skin: Negative for rash.  Neurological: Negative for dizziness or headache.  No other specific complaints in a complete review of systems (except as listed in HPI above).    Objective  Vitals:   05/15/21 0758  BP: 120/76  Pulse: 70  Resp: 16  Temp: 98.2 F (36.8 C)  SpO2: 94%  Weight: 191 lb (86.6 kg)  Height: 5' 4" (1.626 m)    Body mass index is 32.79 kg/m.  Physical Exam  Constitutional: Patient appears well-developed and well-nourished. No distress.  HENT: Head: Normocephalic and atraumatic. Ears: B TMs ok, no erythema or effusion; Nose: Nose normal. Mouth/Throat: not done  Eyes: Conjunctivae and EOM are normal. Pupils are equal, round, and reactive to light. No scleral icterus.  Neck: Normal range of motion. Neck supple. No JVD present. No thyromegaly present.  Cardiovascular: Normal rate, regular rhythm and normal heart sounds.  No murmur heard. No BLE edema. Pulmonary/Chest: Effort normal and breath sounds normal. No respiratory distress. Abdominal: Soft. Bowel sounds are normal, no distension. There is no tenderness. no masses Breast: no lumps or masses, no nipple discharge or rashes FEMALE GENITALIA:  Not done  RECTAL: not done  Musculoskeletal: left knee has some effusion, crepitus with extension of both knees , hammer toes and bunion surgery  Neurological: he is alert and oriented to person, place, and time. No cranial nerve deficit. Coordination, balance, strength, speech and gait are normal.  Skin: Skin is warm and dry. No rash noted. No erythema.  Psychiatric: Patient has a normal mood and affect. behavior is normal. Judgment and thought content normal.   Fall Risk: Fall Risk  05/15/2021 11/14/2020 08/09/2020 08/05/2020 05/13/2020  Falls in the past year? 0 0 0 0 0  Number falls in past yr: 0 0 0 0 0  Comment - - - - -  Injury with Fall? 0 0 0  0 0  Comment - - - - -  Follow up - - Falls evaluation completed - -     Functional Status Survey: Is the patient deaf or have difficulty hearing?: No Does the patient have difficulty seeing, even when wearing glasses/contacts?: No Does the patient have difficulty concentrating,  remembering, or making decisions?: No Does the patient have difficulty walking or climbing stairs?: Yes Does the patient have difficulty dressing or bathing?: No Does the patient have difficulty doing errands alone such as visiting a doctor's office or shopping?: No   Assessment & Plan  1. Well adult exam  - CBC with Differential/Platelet - COMPLETE METABOLIC PANEL WITH GFR  2. Pre-diabetes  - Hemoglobin A1c  3. B12 deficiency  - Vitamin B12  4. Primary osteoarthritis of left knee   5. Leukopenia, unspecified type  - CBC with Differential/Platelet  6. Vitamin D deficiency  - VITAMIN D 25 Hydroxy (Vit-D Deficiency, Fractures)  7. Hyperlipidemia, unspecified hyperlipidemia type  - Lipid panel  8. Breast cancer screening by mammogram  - MM 3D SCREEN BREAST BILATERAL; Future  9. Needs flu shot  She will get it at work     -USPSTF grade A and B recommendations reviewed with patient; age-appropriate recommendations, preventive care, screening tests, etc discussed and encouraged; healthy living encouraged; see AVS for patient education given to patient -Discussed importance of 150 minutes of physical activity weekly, eat two servings of fish weekly, eat one serving of tree nuts ( cashews, pistachios, pecans, almonds.[name]) every other day, eat 6 servings of fruit/vegetables daily and drink plenty of water and avoid sweet beverages.

## 2021-05-15 NOTE — Patient Instructions (Signed)
Preventive Care 63-63 Years Old, Female Preventive care refers to lifestyle choices and visits with your health care provider that can promote health and wellness. This includes: A yearly physical exam. This is also called an annual wellness visit. Regular dental and eye exams. Immunizations. Screening for certain conditions. Healthy lifestyle choices, such as: Eating a healthy diet. Getting regular exercise. Not using drugs or products that contain nicotine and tobacco. Limiting alcohol use. What can I expect for my preventive care visit? Physical exam Your health care provider will check your: Height and weight. These may be used to calculate your BMI (body mass index). BMI is a measurement that tells if you are at a healthy weight. Heart rate and blood pressure. Body temperature. Skin for abnormal spots. Counseling Your health care provider may ask you questions about your: Past medical problems. Family's medical history. Alcohol, tobacco, and drug use. Emotional well-being. Home life and relationship well-being. Sexual activity. Diet, exercise, and sleep habits. Work and work Statistician. Access to firearms. Method of birth control. Menstrual cycle. Pregnancy history. What immunizations do I need?  Vaccines are usually given at various ages, according to a schedule. Your health care provider will recommend vaccines for you based on your age, medicalhistory, and lifestyle or other factors, such as travel or where you work. What tests do I need? Blood tests Lipid and cholesterol levels. These may be checked every 5 years, or more often if you are over 63 years old. Hepatitis C test. Hepatitis B test. Screening Lung cancer screening. You may have this screening every year starting at age 63 if you have a 30-pack-year history of smoking and currently smoke or have quit within the past 15 years. Colorectal cancer screening. All adults should have this screening starting at  age 63 and continuing until age 3. Your health care provider may recommend screening at age 88 if you are at increased risk. You will have tests every 1-10 years, depending on your results and the type of screening test. Diabetes screening. This is done by checking your blood sugar (glucose) after you have not eaten for a while (fasting). You may have this done every 1-3 years. Mammogram. This may be done every 1-2 years. Talk with your health care provider about when you should start having regular mammograms. This may depend on whether you have a family history of breast cancer. BRCA-related cancer screening. This may be done if you have a family history of breast, ovarian, tubal, or peritoneal cancers. Pelvic exam and Pap test. This may be done every 3 years starting at age 63. Starting at age 63, this may be done every 5 years if you have a Pap test in combination with an HPV test. Other tests STD (sexually transmitted disease) testing, if you are at risk. Bone density scan. This is done to screen for osteoporosis. You may have this scan if you are at high risk for osteoporosis. Talk with your health care provider about your test results, treatment options,and if necessary, the need for more tests. Follow these instructions at home: Eating and drinking  Eat a diet that includes fresh fruits and vegetables, whole grains, lean protein, and low-fat dairy products. Take vitamin and mineral supplements as recommended by your health care provider. Do not drink alcohol if: Your health care provider tells you not to drink. You are pregnant, may be pregnant, or are planning to become pregnant. If you drink alcohol: Limit how much you have to 0-1 drink a day. Be aware  of how much alcohol is in your drink. In the [place] one drink equals one 12 oz bottle of beer (355 mL), one 5 oz glass of wine (148 mL), or one 1 oz glass of hard liquor (44 mL).  Lifestyle Take daily care of your teeth and  gums. Brush your teeth every morning and night with fluoride toothpaste. Floss one time each day. Stay active. Exercise for at least 30 minutes 5 or more days each week. Do not use any products that contain nicotine or tobacco, such as cigarettes, e-cigarettes, and chewing tobacco. If you need help quitting, ask your health care provider. Do not use drugs. If you are sexually active, practice safe sex. Use a condom or other form of protection to prevent STIs (sexually transmitted infections). If you do not wish to become pregnant, use a form of birth control. If you plan to become pregnant, see your health care provider for a prepregnancy visit. If told by your health care provider, take low-dose aspirin daily starting at age 63. Find healthy ways to cope with stress, such as: Meditation, yoga, or listening to music. Journaling. Talking to a trusted person. Spending time with friends and family. Safety Always wear your seat belt while driving or riding in a vehicle. Do not drive: If you have been drinking alcohol. Do not ride with someone who has been drinking. When you are tired or distracted. While texting. Wear a helmet and other protective equipment during sports activities. If you have firearms in your house, make sure you follow all gun safety procedures. What's next? Visit your health care provider once a year for an annual wellness visit. Ask your health care provider how often you should have your eyes and teeth checked. Stay up to date on all vaccines. This information is not intended to replace advice given to you by your health care provider. Make sure you discuss any questions you have with your healthcare provider. Document Revised: 06/07/2020 Document Reviewed: 05/15/2018 Elsevier Patient Education  2022 [employer].

## 2021-05-16 LAB — LIPID PANEL
Cholesterol: 188 mg/dL (ref <200)
HDL: 59 mg/dL (ref >=50)
LDL Cholesterol (Calc): 113 mg/dL (calc) — ABNORMAL HIGH
Non-HDL Cholesterol (Calc): 129 mg/dL (calc) (ref <130)
Total CHOL/HDL Ratio: 3.2 (calc) (ref <5.0)
Triglycerides: 75 mg/dL (ref <150)

## 2021-05-16 LAB — COMPLETE METABOLIC PANEL WITH GFR
AG Ratio: 1.7 (calc) (ref 1.0–2.5)
ALT: 9 U/L (ref 6–29)
AST: 15 U/L (ref 10–35)
Albumin: 4 g/dL (ref 3.6–5.1)
Alkaline phosphatase (APISO): 35 U/L — ABNORMAL LOW (ref 37–153)
BUN: 16 mg/dL (ref 7–25)
CO2: 30 mmol/L (ref 20–32)
Calcium: 9.2 mg/dL (ref 8.6–10.4)
Chloride: 106 mmol/L (ref 98–110)
Creat: 0.73 mg/dL (ref 0.50–1.05)
Globulin: 2.4 g/dL (calc) (ref 1.9–3.7)
Glucose, Bld: 76 mg/dL (ref 65–99)
Potassium: 4 mmol/L (ref 3.5–5.3)
Sodium: 141 mmol/L (ref 135–146)
Total Bilirubin: 1 mg/dL (ref 0.2–1.2)
Total Protein: 6.4 g/dL (ref 6.1–8.1)
eGFR: 92 mL/min/{1.73_m2} (ref >=60)

## 2021-05-16 LAB — CBC WITH DIFFERENTIAL/PLATELET
Absolute Monocytes: 465 cells/uL (ref 200–950)
Basophils Absolute: 30 cells/uL (ref 0–200)
Basophils Relative: 0.9 %
Eosinophils Absolute: 79 cells/uL (ref 15–500)
Eosinophils Relative: 2.4 %
HCT: 43 % (ref 35.0–45.0)
Hemoglobin: 13.3 g/dL (ref 11.7–15.5)
Lymphs Abs: 1452 cells/uL (ref 850–3900)
MCH: 29.5 pg (ref 27.0–33.0)
MCHC: 30.9 g/dL — ABNORMAL LOW (ref 32.0–36.0)
MCV: 95.3 fL (ref 80.0–100.0)
MPV: 8.9 fL (ref 7.5–12.5)
Monocytes Relative: 14.1 %
Neutro Abs: 1274 cells/uL — ABNORMAL LOW (ref 1500–7800)
Neutrophils Relative %: 38.6 %
Platelets: 208 10*3/uL (ref 140–400)
RBC: 4.51 10*6/uL (ref 3.80–5.10)
RDW: 12 % (ref 11.0–15.0)
Total Lymphocyte: 44 %
WBC: 3.3 10*3/uL — ABNORMAL LOW (ref 3.8–10.8)

## 2021-05-16 LAB — HEMOGLOBIN A1C
Hgb A1c MFr Bld: 5.5 % of total Hgb (ref <5.7)
Mean Plasma Glucose: 111 mg/dL
eAG (mmol/L): 6.2 mmol/L

## 2021-05-16 LAB — VITAMIN D 25 HYDROXY (VIT D DEFICIENCY, FRACTURES): Vit D, 25-Hydroxy: 30 ng/mL (ref 30–100)

## 2021-05-16 LAB — VITAMIN B12: Vitamin B-12: 764 pg/mL (ref 200–1100)

## 2021-05-25 ENCOUNTER — Telehealth: Payer: Self-pay

## 2021-05-25 NOTE — Telephone Encounter (Signed)
Pt notified about her results from recent labs, but she is concerned of her Low WBC. She wants to know if she can do something about it to bring it up.

## 2021-05-25 NOTE — Telephone Encounter (Signed)
Copied from CRM [id number]. Topic: General - Other >> May 25, 2021  4:04 PM Jaquita Rector A wrote: Reason for CRM: Patient called in to speak to Dr Carlynn Purl or her nurse needing to discuss her lab results. Patient say that she can not get on My Chart and prefer to speak to someone. Can be reached at Ph# [phone]

## 2021-05-26 NOTE — Telephone Encounter (Signed)
Notified pt and denied to see a hematologist.

## 2021-06-29 ENCOUNTER — Other Ambulatory Visit: Payer: Self-pay

## 2021-06-29 ENCOUNTER — Ambulatory Visit (INDEPENDENT_AMBULATORY_CARE_PROVIDER_SITE_OTHER): Payer: BC Managed Care – PPO

## 2021-06-29 DIAGNOSIS — Z23 Encounter for immunization: Secondary | ICD-10-CM

## 2021-07-07 ENCOUNTER — Other Ambulatory Visit: Payer: Self-pay

## 2021-07-07 ENCOUNTER — Ambulatory Visit
Admission: RE | Admit: 2021-07-07 | Discharge: 2021-07-07 | Disposition: A | Discharge status: Home / Self Care | Payer: BC Managed Care – PPO | Source: Ambulatory Visit | Attending: Family Medicine | Admitting: Family Medicine

## 2021-07-07 DIAGNOSIS — Z1231 Encounter for screening mammogram for malignant neoplasm of breast: Secondary | ICD-10-CM | POA: Diagnosis not present

## 2021-08-22 ENCOUNTER — Other Ambulatory Visit: Payer: Self-pay | Admitting: Family Medicine

## 2021-08-22 NOTE — Telephone Encounter (Signed)
Requested Prescriptions  Pending Prescriptions Disp Refills  . loratadine (CLARITIN) 10 MG tablet [Pharmacy Med Name: LORATADINE 10 MG TABLET] 180 tablet 0    Sig: TAKE 1 TABLET BY MOUTH TWICE A DAY     Ear, Nose, and Throat:  Antihistamines Passed - 08/22/2021  1:32 AM      Passed - Valid encounter within last 12 months    Recent Outpatient Visits          3 months ago Well adult exam   [facility] Alba Cory, MD   9 months ago Vitamin D deficiency   [facility] Alba Cory, MD   1 year ago Upper respiratory tract infection, unspecified type   [facility] Danelle Berry, PA-C   1 year ago Upper respiratory tract infection, unspecified type   [facility] Danelle Berry, PA-C   1 year ago Well adult exam   [facility] Alba Cory, MD      Future Appointments            In 2 months Carlynn Purl, Danna Hefty, MD [facility]   In 8 months Alba Cory, MD [facility]

## 2021-09-22 ENCOUNTER — Ambulatory Visit: Payer: Self-pay | Admitting: *Deleted

## 2021-09-22 DIAGNOSIS — U071 COVID-19: Secondary | ICD-10-CM | POA: Diagnosis not present

## 2021-09-22 DIAGNOSIS — Z20822 Contact with and (suspected) exposure to covid-19: Secondary | ICD-10-CM | POA: Diagnosis not present

## 2021-09-22 DIAGNOSIS — R059 Cough, unspecified: Secondary | ICD-10-CM | POA: Diagnosis not present

## 2021-09-22 NOTE — Telephone Encounter (Signed)
Spoke with pt her symptoms began on Thursday and I advised her to go to urgent care. She verbalized understanding and will go tonight or first thing in the morning

## 2021-09-22 NOTE — Telephone Encounter (Signed)
° °  Chief Complaint: + COVID Symptoms: congestion, fever Frequency: symptoms started Wednesday pm Pertinent Negatives: Patient denies SOB, cough Disposition: [] ED /[] Urgent Care (no appt availability in office) / [] Appointment(In office/virtual)/ []  [facility]/ [x] Home Care/ [] Refused Recommended Disposition /[] [place] [facility]/ []  Follow-up with PCP Additional Notes: + COVID, low risk, age 66, patient advised per COVID protocol: treatment/isolation Summary: positive covid   Pt called saying she tested positive for covid.  Fever 100-101 sinus pressure,  Throat pain.   Please advise   [phone]      Reason for Disposition  [1] COVID-19 diagnosed by positive lab test (e.g., PCR, rapid self-test kit) AND [2] mild symptoms (e.g., cough, fever, others) AND [3] no complications or SOB  Answer Assessment - Initial Assessment Questions 1. COVID-19 DIAGNOSIS: "Who made your COVID-19 diagnosis?" "Was it confirmed by a positive lab test or self-test?" If not diagnosed by a doctor (or NP/PA), ask "Are there lots of cases (community spread) where you live?" Note: See public health department website, if unsure.     Home test and [facility]- last night 2. COVID-19 EXPOSURE: "Was there any known exposure to COVID before the symptoms began?" CDC Definition of close contact: within 6 feet (2 meters) for a total of 15 minutes or more over a 24-hour period.      unknown 3. ONSET: "When did the COVID-19 symptoms start?"      Wednesday night 4. WORST SYMPTOM: "What is your worst symptom?" (e.g., cough, fever, shortness of breath, muscle aches)     Sinus congestion 5. COUGH: "Do you have a cough?" If Yes, ask: "How bad is the cough?"       Stopped-now 6. FEVER: "Do you have a fever?" If Yes, ask: "What is your temperature, how was it measured, and when did it start?"     101- today, oral 7. RESPIRATORY STATUS: "Describe your breathing?" (e.g., shortness of breath, wheezing, unable to speak)       normal 8. BETTER-SAME-WORSE: "Are you getting better, staying the same or getting worse compared to yesterday?"  If getting worse, ask, "In what way?"     Better today 9. HIGH RISK DISEASE: "Do you have any chronic medical problems?" (e.g., asthma, heart or lung disease, weak immune system, obesity, etc.)     no 10. VACCINE: "Have you had the COVID-19 vaccine?" If Yes, ask: "Which one, how many shots, when did you get it?"       yes 11. BOOSTER: "Have you received your COVID-19 booster?" If Yes, ask: "Which one and when did you get it?"       Yes-04/2021-Pfizer  12. PREGNANCY: "Is there any chance you are pregnant?" "When was your last menstrual period?"       na 13. OTHER SYMPTOMS: "Do you have any other symptoms?"  (e.g., chills, fatigue, headache, loss of smell or taste, muscle pain, sore throat)       Chills, congestion 14. O2 SATURATION MONITOR:  "Do you use an oxygen saturation monitor (pulse oximeter) at home?" If Yes, ask "What is your reading (oxygen level) today?" "What is your usual oxygen saturation reading?" (e.g., 95%)       na  Protocols used: Coronavirus (COVID-19) Diagnosed or Suspected-A-AH

## 2021-09-25 ENCOUNTER — Telehealth: Payer: Self-pay

## 2021-09-25 NOTE — Telephone Encounter (Signed)
Pt Covid dx and asking about isolation and retesting. Advised pt that after qurantine id over and no fever, she can go back to work. Advised pt that she may test positive for up too 90 days but it was up to her when she wanted to retest. Advised pt to wait at least 2 days after qurantine is over to get booster. Pt verbalized understanding.

## 2021-10-04 ENCOUNTER — Other Ambulatory Visit: Payer: Self-pay | Admitting: Family Medicine

## 2021-10-04 NOTE — Telephone Encounter (Signed)
Requested medications are due for refill today.  Unsure  Requested medications are on the active medications list.  no  Last refill. Unknown  Future visit scheduled.   yes  Notes to clinic.  Medication not on med list. Please review.    Requested Prescriptions  Pending Prescriptions Disp Refills   triamcinolone cream (KENALOG) 0.1 % [Pharmacy Med Name: TRIAMCINOLONE 0.1% CREAM] 45 g     Sig: APPLY TO AFFECTED AREA TWICE A DAY     Dermatology:  Corticosteroids Passed - 10/04/2021  5:17 PM      Passed - Valid encounter within last 12 months    Recent Outpatient Visits           4 months ago Well adult exam   [facility], MD   10 months ago Vitamin D deficiency   [facility] Alba Cory, MD   1 year ago Upper respiratory tract infection, unspecified type   [facility] Danelle Berry, PA-C   1 year ago Upper respiratory tract infection, unspecified type   [facility] Danelle Berry, PA-C   1 year ago Well adult exam   [facility] Alba Cory, MD       Future Appointments             In 1 month Alba Cory, MD [facility]   In 7 months Alba Cory, MD [facility]

## 2021-11-13 NOTE — Progress Notes (Signed)
Name: Kathy Benjamin   MRN: [id number]    DOB: 01/27/1956   Date:11/14/2021       Progress Note  Subjective  Chief Complaint  Follow Up  HPI  Other Leucopenia: stable, last level was 3.3 , previously it was 2.9 No anemia and normal platelets, we will continue to monitor    Pre-diabetes: her A1C went from  5.6% to 5.8%,  5.5%, 5.6 % last time 5.5 %  She denies polyphagia, polydipsia or polyuria. She likes sweets but she also eats a lot of healthy food and mostly drinks water.  She only drinks sweet and soda intermittently    OA left knee: She has effusion intermittently. Pain is dull and mild, states under control. She walks at work and stands all day. She takes Tylenol prn   Vitamin D deficiency: continue vitamin D  2000 units daily last level was back to normal at 30   History of cystocele: she had a hysterectomy, she also has a history of pelvic floor dysfunction but doing well, only has incontinence when she holds for a while and has to rush  Dyslipidemia: last LDL was 113   The 10-year ASCVD risk score (Arnett DK, et al., 2019) is: 3.3%   Values used to calculate the score:     Age: 66 years     Sex: Female     Is Non-Hispanic African American: Yes     Diabetic: No     Tobacco smoker: No     Systolic Blood Pressure: 96 mmHg     Is BP treated: No     HDL Cholesterol: 59 mg/dL     Total Cholesterol: 188 mg/dL   Patient Active Problem List   Diagnosis Date Noted   Other neutropenia ([place]) 11/14/2021   Osteopenia 07/02/2018   Bradycardia 05/07/2018   Leucocytosis 05/10/2017   Urge incontinence of urine 05/06/2017   Osteoarthritis of right hip 05/06/2017   Post herpetic neuralgia 08/15/2015   Bunion of left foot 05/02/2015   H/O: hysterectomy 05/02/2015   Edema leg 04/29/2015   Obesity (BMI 30.0-34.9) 04/29/2015   Primary localized osteoarthrosis, lower leg 04/29/2015   Perennial allergic rhinitis with seasonal variation 04/29/2015   Borderline diabetes 04/29/2015    Menopausal symptom 04/29/2015   Vitamin D deficiency 04/29/2015   Abnormal electrocardiogram 04/02/2007    Past Surgical History:  Procedure Laterality Date   ABDOMINAL HYSTERECTOMY     BREAST EXCISIONAL BIOPSY Right 2009   neg   BREAST SURGERY Right [id number]   biopsy   COLONOSCOPY     COLONOSCOPY WITH PROPOFOL N/A 08/05/2015   Procedure: COLONOSCOPY WITH PROPOFOL;  Surgeon: Lucilla Lame, MD;  Location: [facility];  Service: Endoscopy;  Laterality: N/A;   HERNIA REPAIR     umbilical    Family History  Problem Relation Age of Onset   Diabetes Mother    Hypertension Mother    Stroke Father    Hypertension Father    Cancer Father    Hyperthyroidism Sister    Cancer Brother        prostate   Hyperthyroidism Brother    Kidney disease Sister    Breast cancer Neg Hx     Social History   Tobacco Use   Smoking status: Never   Smokeless tobacco: Never  Substance Use Topics   Alcohol use: No    Alcohol/week: 0.0 standard drinks     Current Outpatient Medications:    acetaminophen (TYLENOL) 500 MG tablet, Take 1  tablet (500 mg total) by mouth every 6 (six) hours as needed., Disp: 90 tablet, Rfl: 1   Cholecalciferol (VITAMIN D) 2000 units CAPS, Take 1 capsule by mouth daily., Disp: , Rfl:    diclofenac Sodium (VOLTAREN) 1 % GEL, Apply 4 g topically 4 (four) times daily., Disp: 100 g, Rfl: 1   fluticasone (FLONASE) 50 MCG/ACT nasal spray, SPRAY 2 SPRAYS INTO EACH NOSTRIL EVERY DAY, Disp: 16 mL, Rfl: 4   loratadine (CLARITIN) 10 MG tablet, TAKE 1 TABLET BY MOUTH TWICE A DAY, Disp: 180 tablet, Rfl: 0   Misc Natural Products (GLUCOSAMINE CHOND CMP DOUBLE PO), Take by mouth., Disp: , Rfl:    triamcinolone cream (KENALOG) 0.1 %, APPLY TO AFFECTED AREA TWICE A DAY, Disp: 45 g, Rfl: 0  No Known Allergies  I personally reviewed active problem list, medication list, allergies, family history, social history, health maintenance with the patient/caregiver  today.   ROS  Constitutional: Negative for fever or weight change.  Respiratory: Negative for cough and shortness of breath.   Cardiovascular: Negative for chest pain or palpitations.  Gastrointestinal: Negative for abdominal pain, no bowel changes.  Musculoskeletal: Negative for gait problem , left knee intermittent joint swelling.  Skin: Negative for rash.  Neurological: Negative for dizziness or headache.  No other specific complaints in a complete review of systems (except as listed in HPI above).   Objective  Vitals:   11/14/21 1530  BP: 96/60  Pulse: 90  Resp: 16  Temp: 97.9 F (36.6 C)  TempSrc: Oral  SpO2: 98%  Weight: 197 lb 6.4 oz (89.5 kg)  Height: 5\' 4"  (1.626 m)    Body mass index is 33.88 kg/m.  Physical Exam  Constitutional: Patient appears well-developed and well-nourished. Obese  No distress.  HEENT: head atraumatic, normocephalic, pupils equal and reactive to light, neck supple Cardiovascular: Normal rate, regular rhythm and normal heart sounds.  No murmur heard. Non pitting  BLE edema. Pulmonary/Chest: Effort normal and breath sounds normal. No respiratory distress. Abdominal: Soft.  There is no tenderness. Psychiatric: Patient has a normal mood and affect. behavior is normal. Judgment and thought content normal.  Muscular skeletal: positive for crepitus with extension of both knees , effusion of left knee, no erythema or increase in warmth   PHQ2/9: Depression screen [facility] 2/9 11/14/2021 05/15/2021 11/14/2020 08/09/2020 08/05/2020  Decreased Interest 0 0 0 0 0  Down, Depressed, Hopeless 0 0 0 0 0  PHQ - 2 Score 0 0 0 0 0  Altered sleeping 0 - - - -  Tired, decreased energy 0 - - - -  Change in appetite 0 - - - -  Feeling bad or failure about yourself  0 - - - -  Trouble concentrating 0 - - - -  Moving slowly or fidgety/restless 0 - - - -  Suicidal thoughts 0 - - - -  PHQ-9 Score 0 - - - -  Difficult doing work/chores Not difficult at all - - - -   Some recent data might be hidden    phq 9 is negative   Fall Risk: Fall Risk  11/14/2021 05/15/2021 11/14/2020 08/09/2020 08/05/2020  Falls in the past year? 0 0 0 0 0  Number falls in past yr: 0 0 0 0 0  Comment - - - - -  Injury with Fall? 0 0 0 0 0  Comment - - - - -  Risk for fall due to : No Fall Risks - - - -  Follow up Falls prevention discussed - - Falls evaluation completed -      Functional Status Survey: Is the patient deaf or have difficulty hearing?: No Does the patient have difficulty seeing, even when wearing glasses/contacts?: No Does the patient have difficulty concentrating, remembering, or making decisions?: No Does the patient have difficulty walking or climbing stairs?: No Does the patient have difficulty dressing or bathing?: No Does the patient have difficulty doing errands alone such as visiting a doctor's office or shopping?: No    Assessment & Plan  1. Other neutropenia ([place])  We will recheck in the Summer   2. B12 deficiency  Taking it about once a week  3. Vitamin D deficiency  Taking it daily   4. Primary osteoarthritis of left knee  Taking Tylenol prn   5. Pre-diabetes  Recheck levels in the Summer   6. Pelvic floor weakness  Discussed B12   7. Hypercholesterolemia

## 2021-11-14 ENCOUNTER — Other Ambulatory Visit: Payer: Self-pay

## 2021-11-14 ENCOUNTER — Encounter: Payer: Self-pay | Admitting: Family Medicine

## 2021-11-14 ENCOUNTER — Ambulatory Visit: Payer: BC Managed Care – PPO | Admitting: Family Medicine

## 2021-11-14 VITALS — BP 96/60 | HR 90 | Temp 97.9°F | Resp 16 | Ht 64.0 in | Wt 197.4 lb

## 2021-11-14 DIAGNOSIS — E559 Vitamin D deficiency, unspecified: Secondary | ICD-10-CM

## 2021-11-14 DIAGNOSIS — E538 Deficiency of other specified B group vitamins: Secondary | ICD-10-CM | POA: Diagnosis not present

## 2021-11-14 DIAGNOSIS — D708 Other neutropenia: Secondary | ICD-10-CM

## 2021-11-14 DIAGNOSIS — M1712 Unilateral primary osteoarthritis, left knee: Secondary | ICD-10-CM | POA: Diagnosis not present

## 2021-11-14 DIAGNOSIS — N8189 Other female genital prolapse: Secondary | ICD-10-CM

## 2021-11-14 DIAGNOSIS — E7800 Pure hypercholesterolemia, unspecified: Secondary | ICD-10-CM

## 2021-11-14 DIAGNOSIS — R7303 Prediabetes: Secondary | ICD-10-CM

## 2021-12-21 ENCOUNTER — Telehealth: Payer: Self-pay

## 2021-12-21 NOTE — Telephone Encounter (Signed)
Copied from CRM [id number]. Topic: General - Other >> Dec 21, 2021 12:34 PM Pawlus, Maxine Glenn A wrote: Reason for CRM: Pt wanted a call back to go over which vaccinations she has had, pt wanted to know when her last Flu and pneumonia shot were done.

## 2021-12-22 NOTE — Telephone Encounter (Signed)
Spoke with patient, answered all her questions and she was pleasant and verbalized understanding.  ?

## 2022-02-15 ENCOUNTER — Other Ambulatory Visit: Payer: Self-pay | Admitting: Family Medicine

## 2022-05-15 NOTE — Progress Notes (Unsigned)
Name: Kathy Benjamin   MRN: [id number]    DOB: August 21, 1956   Date:05/16/2022       Progress Note  Subjective  Chief Complaint  Annual Exam  HPI  Patient presents for annual CPE.  Diet: she tries to eat balanced but not very this good this Summer  Exercise: continue regular physical activity   Last Eye Exam: once a year - Dr. Marvel Plan  Last Dental Exam: every 6 months   [place] Office Visit from [facility]/16/2022 in [facility]  AUDIT-C Score 0      Depression: Phq 9 is  negative    05/16/2022    1:06 PM 11/14/2021    3:24 PM 05/15/2021    7:59 AM 11/14/2020    9:14 AM 08/09/2020    9:15 AM  Depression screen PHQ 2/9  Decreased Interest 0 0 0 0 0  Down, Depressed, Hopeless 0 0 0 0 0  PHQ - 2 Score 0 0 0 0 0  Altered sleeping 0 0     Tired, decreased energy 0 0     Change in appetite 0 0     Feeling bad or failure about yourself  0 0     Trouble concentrating 0 0     Moving slowly or fidgety/restless 0 0     Suicidal thoughts 0 0     PHQ-9 Score 0 0     Difficult doing work/chores Not difficult at all Not difficult at all      Hypertension: BP Readings from Last 3 Encounters:  05/16/22 94/62  11/14/21 96/60  05/15/21 120/76   Obesity: Wt Readings from Last 3 Encounters:  05/16/22 195 lb 4.8 oz (88.6 kg)  11/14/21 197 lb 6.4 oz (89.5 kg)  05/15/21 191 lb (86.6 kg)   BMI Readings from Last 3 Encounters:  05/16/22 35.15 kg/m  11/14/21 33.88 kg/m  05/15/21 32.79 kg/m     Vaccines:   HPV: N/A Tdap: up to date Shingrix: up to date Pneumonia: N/A Flu: due - she will get it at work  COVID-19: up to date, discussed booster this Fall    Hep C Screening: 04/16/13 STD testing and prevention (HIV/chl/gon/syphilis): 05/02/15 Intimate partner violence: negative screen  Sexual History : not sexually active  Menstrual History/LMP/Abnormal Bleeding: s/p hysterectomy  Discussed importance of follow up if any post-menopausal bleeding: not  applicable  Incontinence Symptoms: she has noticed some urinary urgency , does not want to see Urologist at this time   Breast cancer:  - Last Mammogram: 07/07/21 - BRCA gene screening: N/A  Osteoporosis Prevention : Discussed high calcium and vitamin D supplementation, weight bearing exercises Bone density: 07/01/18   Cervical cancer screening: N/A  Skin cancer: Discussed monitoring for atypical lesions  Colorectal cancer: 08/05/15   Lung cancer:  Low Dose CT Chest recommended if Age 95-80 years, 20 pack-year currently smoking OR have quit w/in 15years. Patient does not qualify for screen   ECG: 05/07/18  Advanced Care Planning: A voluntary discussion about advance care planning including the explanation and discussion of advance directives.  Discussed health care proxy and Living will, and the patient was able to identify a health care proxy as oldest sister - [name].  Patient does not have a living will and power of attorney of health care   Lipids: Lab Results  Component Value Date   CHOL 188 05/15/2021   CHOL 189 05/13/2020   CHOL 185 05/11/2019   Lab Results  Component Value  Date   HDL 59 05/15/2021   HDL 62 05/13/2020   HDL 57 05/11/2019   Lab Results  Component Value Date   LDLCALC 113 (H) 05/15/2021   LDLCALC 112 (H) 05/13/2020   LDLCALC 111 (H) 05/11/2019   Lab Results  Component Value Date   TRIG 75 05/15/2021   TRIG 61 05/13/2020   TRIG 79 05/11/2019   Lab Results  Component Value Date   CHOLHDL 3.2 05/15/2021   CHOLHDL 3.0 05/13/2020   CHOLHDL 3.2 05/11/2019   No results found for: "LDLDIRECT"  Glucose: Glucose, Bld  Date Value Ref Range Status  05/15/2021 76 65 - 99 mg/dL Final    Comment:    .            Fasting reference interval .   05/13/2020 81 65 - 99 mg/dL Final    Comment:    .            Fasting reference interval .   05/11/2019 81 65 - 99 mg/dL Final    Comment:    .            Fasting reference interval .      Patient Active Problem List   Diagnosis Date Noted   Other neutropenia ([place]) 11/14/2021   Osteopenia 07/02/2018   Bradycardia 05/07/2018   Leucocytosis 05/10/2017   Urge incontinence of urine 05/06/2017   Osteoarthritis of right hip 05/06/2017   Post herpetic neuralgia 08/15/2015   Bunion of left foot 05/02/2015   H/O: hysterectomy 05/02/2015   Edema leg 04/29/2015   Obesity (BMI 30.0-34.9) 04/29/2015   Primary localized osteoarthrosis, lower leg 04/29/2015   Perennial allergic rhinitis with seasonal variation 04/29/2015   Borderline diabetes 04/29/2015   Menopausal symptom 04/29/2015   Vitamin D deficiency 04/29/2015   Abnormal electrocardiogram 04/02/2007    Past Surgical History:  Procedure Laterality Date   ABDOMINAL HYSTERECTOMY     BREAST EXCISIONAL BIOPSY Right 2009   neg   BREAST SURGERY Right [id number]   biopsy   COLONOSCOPY     COLONOSCOPY WITH PROPOFOL N/A 08/05/2015   Procedure: COLONOSCOPY WITH PROPOFOL;  Surgeon: Lucilla Lame, MD;  Location: [place];  Service: Endoscopy;  Laterality: N/A;   HERNIA REPAIR     umbilical    Family History  Problem Relation Age of Onset   Diabetes Mother    Hypertension Mother    Stroke Father    Hypertension Father    Cancer Father    Hyperthyroidism Sister    Cancer Brother        prostate   Hyperthyroidism Brother    Kidney disease Sister    Breast cancer Neg Hx     Social History   Socioeconomic History   Marital status: Single    Spouse name: Not on file   Number of children: 0   Years of education: Not on file   Highest education level: High school graduate  Occupational History   Not on file  Tobacco Use   Smoking status: Never   Smokeless tobacco: Never  Vaping Use   Vaping Use: Never used  Substance and Sexual Activity   Alcohol use: No    Alcohol/week: 0.0 standard drinks of alcohol   Drug use: No   Sexual activity: Not Currently  Other Topics Concern   Not on file  Social  History Narrative   Not working since March 31 st 2020 because of COVID-19 may be re-hired after the Summer  Social Determinants of Health   Financial Resource Strain: Low Risk  (05/16/2022)   Overall Financial Resource Strain (CARDIA)    Difficulty of Paying Living Expenses: Not hard at all  Food Insecurity: No Food Insecurity (05/16/2022)   Hunger Vital Sign    Worried About Running Out of Food in the Last Year: Never true    Ran Out of Food in the Last Year: Never true  Transportation Needs: No Transportation Needs (05/16/2022)   PRAPARE - Hydrologist (Medical): No    Lack of Transportation (Non-Medical): No  Physical Activity: Sufficiently Active (05/16/2022)   Exercise Vital Sign    Days of Exercise per Week: 5 days    Minutes of Exercise per Session: 30 min  Stress: No Stress Concern Present (05/16/2022)   [place]    Feeling of Stress : Not at all  Social Connections: Moderately Integrated (05/16/2022)   Social Connection and Isolation Panel [NHANES]    Frequency of Communication with Friends and Family: More than three times a week    Frequency of Social Gatherings with Friends and Family: More than three times a week    Attends Religious Services: 1 to 4 times per year    Active Member of [employer] or Organizations: Yes    Attends Music therapist: More than 4 times per year    Marital Status: Never married  Intimate Partner Violence: Not At Risk (05/16/2022)   Humiliation, Afraid, Rape, and Kick questionnaire    Fear of Current or Ex-Partner: No    Emotionally Abused: No    Physically Abused: No    Sexually Abused: No     Current Outpatient Medications:    Cholecalciferol (VITAMIN D) 2000 units CAPS, Take 1 capsule by mouth daily., Disp: , Rfl:    diclofenac Sodium (VOLTAREN) 1 % GEL, Apply 4 g topically 4 (four) times daily., Disp: 100 g, Rfl: 1   fluticasone  (FLONASE) 50 MCG/ACT nasal spray, SPRAY 2 SPRAYS INTO EACH NOSTRIL EVERY DAY, Disp: 16 mL, Rfl: 4   loratadine (CLARITIN) 10 MG tablet, TAKE 1 TABLET BY MOUTH TWICE A DAY, Disp: 180 tablet, Rfl: 0   Misc Natural Products (GLUCOSAMINE CHOND CMP DOUBLE PO), Take by mouth., Disp: , Rfl:    triamcinolone cream (KENALOG) 0.1 %, APPLY TO AFFECTED AREA TWICE A DAY, Disp: 45 g, Rfl: 0   acetaminophen (TYLENOL) 500 MG tablet, Take 1 tablet (500 mg total) by mouth every 6 (six) hours as needed. (Patient not taking: Reported on 05/16/2022), Disp: 90 tablet, Rfl: 1  No Known Allergies   ROS  Constitutional: Negative for fever or weight change.  Respiratory: Negative for cough and shortness of breath.   Cardiovascular: Negative for chest pain or palpitations.  Gastrointestinal: Negative for abdominal pain, no bowel changes.  Musculoskeletal: Negative for gait problem or joint swelling.  Skin: Negative for rash.  Neurological: Negative for dizziness or headache.  No other specific complaints in a complete review of systems (except as listed in HPI above).   Objective  Vitals:   05/16/22 1315  BP: 94/62  Pulse: 81  Resp: 16  Temp: 98 F (36.7 C)  TempSrc: Oral  SpO2: 96%  Weight: 195 lb 4.8 oz (88.6 kg)  Height: 5' 2.5" (1.588 m)    Body mass index is 35.15 kg/m.  Physical Exam  Constitutional: Patient appears well-developed and well-nourished. No distress.  HENT: Head: Normocephalic and atraumatic.  Ears: B TMs ok, no erythema or effusion; Nose: Nose normal. Mouth/Throat: Oropharynx is clear and moist. No oropharyngeal exudate.  Eyes: Conjunctivae and EOM are normal. Pupils are equal, round, and reactive to light. No scleral icterus.  Neck: Normal range of motion. Neck supple. No JVD present. No thyromegaly present.  Cardiovascular: Normal rate, regular rhythm and normal heart sounds.  No murmur heard. No BLE edema. Pulmonary/Chest: Effort normal and breath sounds normal. No respiratory  distress. Abdominal: Soft. Bowel sounds are normal, no distension. There is no tenderness. no masses Breast: no lumps or masses, no nipple discharge or rashes FEMALE GENITALIA:  Not done  RECTAL:not done  Musculoskeletal: Normal range of motion, no joint effusions. No gross deformities Neurological: he is alert and oriented to person, place, and time. No cranial nerve deficit. Coordination, balance, strength, speech and gait are normal.  Skin: Skin is warm and dry. No rash noted. No erythema.  Psychiatric: Patient has a normal mood and affect. behavior is normal. Judgment and thought content normal.    Fall Risk:    05/16/2022    1:06 PM 11/14/2021    3:24 PM 05/15/2021    7:59 AM 11/14/2020    9:13 AM 08/09/2020    9:15 AM  Fall Risk   Falls in the past year? 0 0 0 0 0  Number falls in past yr: 0 0 0 0 0  Injury with Fall? 0 0 0 0 0  Risk for fall due to : No Fall Risks No Fall Risks     Follow up Falls prevention discussed;Education provided Falls prevention discussed   Falls evaluation completed     Functional Status Survey: Is the patient deaf or have difficulty hearing?: No Does the patient have difficulty seeing, even when wearing glasses/contacts?: No Does the patient have difficulty concentrating, remembering, or making decisions?: No Does the patient have difficulty walking or climbing stairs?: No Does the patient have difficulty dressing or bathing?: No Does the patient have difficulty doing errands alone such as visiting a doctor's office or shopping?: No   Assessment & Plan  1. Well adult exam  - Lipid panel - CBC with Differential/Platelet - COMPLETE METABOLIC PANEL WITH GFR - Hemoglobin A1c - MM 3D SCREEN BREAST BILATERAL; Future  2. Pre-diabetes  - Hemoglobin A1c  3. Other neutropenia ([facility])  - CBC with Differential/Platelet  4. Breast cancer screening by mammogram  - MM 3D SCREEN BREAST BILATERAL; Future  5. Lipid screening  - Lipid panel     -USPSTF grade A and B recommendations reviewed with patient; age-appropriate recommendations, preventive care, screening tests, etc discussed and encouraged; healthy living encouraged; see AVS for patient education given to patient -Discussed importance of 150 minutes of physical activity weekly, eat two servings of fish weekly, eat one serving of tree nuts ( cashews, pistachios, pecans, almonds.[name]) every other day, eat 6 servings of fruit/vegetables daily and drink plenty of water and avoid sweet beverages.   -Reviewed Health Maintenance: Yes.

## 2022-05-15 NOTE — Patient Instructions (Signed)
Preventive Care 40-64 Years Old, Female Preventive care refers to lifestyle choices and visits with your health care provider that can promote health and wellness. Preventive care visits are also called wellness exams. What can I expect for my preventive care visit? Counseling Your health care provider may ask you questions about your: Medical history, including: Past medical problems. Family medical history. Pregnancy history. Current health, including: Menstrual cycle. Method of birth control. Emotional well-being. Home life and relationship well-being. Sexual activity and sexual health. Lifestyle, including: Alcohol, nicotine or tobacco, and drug use. Access to firearms. Diet, exercise, and sleep habits. Work and work environment. Sunscreen use. Safety issues such as seatbelt and bike helmet use. Physical exam Your health care provider will check your: Height and weight. These may be used to calculate your BMI (body mass index). BMI is a measurement that tells if you are at a healthy weight. Waist circumference. This measures the distance around your waistline. This measurement also tells if you are at a healthy weight and may help predict your risk of certain diseases, such as type 2 diabetes and high blood pressure. Heart rate and blood pressure. Body temperature. Skin for abnormal spots. What immunizations do I need?  Vaccines are usually given at various ages, according to a schedule. Your health care provider will recommend vaccines for you based on your age, medical history, and lifestyle or other factors, such as travel or where you work. What tests do I need? Screening Your health care provider may recommend screening tests for certain conditions. This may include: Lipid and cholesterol levels. Diabetes screening. This is done by checking your blood sugar (glucose) after you have not eaten for a while (fasting). Pelvic exam and Pap test. Hepatitis B test. Hepatitis C  test. HIV (human immunodeficiency virus) test. STI (sexually transmitted infection) testing, if you are at risk. Lung cancer screening. Colorectal cancer screening. Mammogram. Talk with your health care provider about when you should start having regular mammograms. This may depend on whether you have a family history of breast cancer. BRCA-related cancer screening. This may be done if you have a family history of breast, ovarian, tubal, or peritoneal cancers. Bone density scan. This is done to screen for osteoporosis. Talk with your health care provider about your test results, treatment options, and if necessary, the need for more tests. Follow these instructions at home: Eating and drinking  Eat a diet that includes fresh fruits and vegetables, whole grains, lean protein, and low-fat dairy products. Take vitamin and mineral supplements as recommended by your health care provider. Do not drink alcohol if: Your health care provider tells you not to drink. You are pregnant, may be pregnant, or are planning to become pregnant. If you drink alcohol: Limit how much you have to 0-1 drink a day. Know how much alcohol is in your drink. In the [place] one drink equals one 12 oz bottle of beer (355 mL), one 5 oz glass of wine (148 mL), or one 1 oz glass of hard liquor (44 mL). Lifestyle Brush your teeth every morning and night with fluoride toothpaste. Floss one time each day. Exercise for at least 30 minutes 5 or more days each week. Do not use any products that contain nicotine or tobacco. These products include cigarettes, chewing tobacco, and vaping devices, such as e-cigarettes. If you need help quitting, ask your health care provider. Do not use drugs. If you are sexually active, practice safe sex. Use a condom or other form of protection to   prevent STIs. If you do not wish to become pregnant, use a form of birth control. If you plan to become pregnant, see your health care provider for a  prepregnancy visit. Take aspirin only as told by your health care provider. Make sure that you understand how much to take and what form to take. Work with your health care provider to find out whether it is safe and beneficial for you to take aspirin daily. Find healthy ways to manage stress, such as: Meditation, yoga, or listening to music. Journaling. Talking to a trusted person. Spending time with friends and family. Minimize exposure to UV radiation to reduce your risk of skin cancer. Safety Always wear your seat belt while driving or riding in a vehicle. Do not drive: If you have been drinking alcohol. Do not ride with someone who has been drinking. When you are tired or distracted. While texting. If you have been using any mind-altering substances or drugs. Wear a helmet and other protective equipment during sports activities. If you have firearms in your house, make sure you follow all gun safety procedures. Seek help if you have been physically or sexually abused. What's next? Visit your health care provider once a year for an annual wellness visit. Ask your health care provider how often you should have your eyes and teeth checked. Stay up to date on all vaccines. This information is not intended to replace advice given to you by your health care provider. Make sure you discuss any questions you have with your health care provider. Document Revised: 03/01/2021 Document Reviewed: 03/01/2021 Elsevier Patient Education  [place].

## 2022-05-16 ENCOUNTER — Encounter: Payer: Self-pay | Admitting: Family Medicine

## 2022-05-16 ENCOUNTER — Ambulatory Visit (INDEPENDENT_AMBULATORY_CARE_PROVIDER_SITE_OTHER): Payer: BC Managed Care – PPO | Admitting: Family Medicine

## 2022-05-16 VITALS — BP 94/62 | HR 81 | Temp 98.0°F | Resp 16 | Ht 62.5 in | Wt 195.3 lb

## 2022-05-16 DIAGNOSIS — Z1231 Encounter for screening mammogram for malignant neoplasm of breast: Secondary | ICD-10-CM | POA: Diagnosis not present

## 2022-05-16 DIAGNOSIS — R7303 Prediabetes: Secondary | ICD-10-CM | POA: Diagnosis not present

## 2022-05-16 DIAGNOSIS — Z13220 Encounter for screening for lipoid disorders: Secondary | ICD-10-CM | POA: Diagnosis not present

## 2022-05-16 DIAGNOSIS — D708 Other neutropenia: Secondary | ICD-10-CM | POA: Diagnosis not present

## 2022-05-16 DIAGNOSIS — Z0000 Encounter for general adult medical examination without abnormal findings: Secondary | ICD-10-CM

## 2022-05-17 LAB — COMPLETE METABOLIC PANEL WITH GFR
AG Ratio: 1.8 (calc) (ref 1.0–2.5)
ALT: 10 U/L (ref 6–29)
AST: 17 U/L (ref 10–35)
Albumin: 4.2 g/dL (ref 3.6–5.1)
Alkaline phosphatase (APISO): 34 U/L — ABNORMAL LOW (ref 37–153)
BUN: 18 mg/dL (ref 7–25)
CO2: 27 mmol/L (ref 20–32)
Calcium: 9.3 mg/dL (ref 8.6–10.4)
Chloride: 106 mmol/L (ref 98–110)
Creat: 0.7 mg/dL (ref 0.50–1.05)
Globulin: 2.4 g/dL (calc) (ref 1.9–3.7)
Glucose, Bld: 74 mg/dL (ref 65–99)
Potassium: 3.8 mmol/L (ref 3.5–5.3)
Sodium: 142 mmol/L (ref 135–146)
Total Bilirubin: 1.2 mg/dL (ref 0.2–1.2)
Total Protein: 6.6 g/dL (ref 6.1–8.1)
eGFR: 97 mL/min/{1.73_m2} (ref >=60)

## 2022-05-17 LAB — CBC WITH DIFFERENTIAL/PLATELET
Absolute Monocytes: 416 cells/uL (ref 200–950)
Basophils Absolute: 20 cells/uL (ref 0–200)
Basophils Relative: 0.6 %
Eosinophils Absolute: 69 cells/uL (ref 15–500)
Eosinophils Relative: 2.1 %
HCT: 38.3 % (ref 35.0–45.0)
Hemoglobin: 12.6 g/dL (ref 11.7–15.5)
Lymphs Abs: 1396 cells/uL (ref 850–3900)
MCH: 30.7 pg (ref 27.0–33.0)
MCHC: 32.9 g/dL (ref 32.0–36.0)
MCV: 93.2 fL (ref 80.0–100.0)
MPV: 9.5 fL (ref 7.5–12.5)
Monocytes Relative: 12.6 %
Neutro Abs: 1399 cells/uL — ABNORMAL LOW (ref 1500–7800)
Neutrophils Relative %: 42.4 %
Platelets: 189 10*3/uL (ref 140–400)
RBC: 4.11 10*6/uL (ref 3.80–5.10)
RDW: 12.2 % (ref 11.0–15.0)
Total Lymphocyte: 42.3 %
WBC: 3.3 10*3/uL — ABNORMAL LOW (ref 3.8–10.8)

## 2022-05-17 LAB — HEMOGLOBIN A1C
Hgb A1c MFr Bld: 5.5 % of total Hgb (ref <5.7)
Mean Plasma Glucose: 111 mg/dL
eAG (mmol/L): 6.2 mmol/L

## 2022-05-17 LAB — LIPID PANEL
Cholesterol: 193 mg/dL (ref <200)
HDL: 64 mg/dL (ref >=50)
LDL Cholesterol (Calc): 114 mg/dL (calc) — ABNORMAL HIGH
Non-HDL Cholesterol (Calc): 129 mg/dL (calc) (ref <130)
Total CHOL/HDL Ratio: 3 (calc) (ref <5.0)
Triglycerides: 60 mg/dL (ref <150)

## 2022-06-26 ENCOUNTER — Telehealth: Payer: Self-pay | Admitting: Family Medicine

## 2022-06-26 ENCOUNTER — Other Ambulatory Visit (HOSPITAL_BASED_OUTPATIENT_CLINIC_OR_DEPARTMENT_OTHER): Payer: Self-pay

## 2022-06-26 NOTE — Telephone Encounter (Signed)
Chart updated

## 2022-06-26 NOTE — Telephone Encounter (Signed)
Copied from [id number]. Topic: General - Other >> Jun 26, 2022  4:18 PM Sabas Sous wrote: Reason for CRM: Pt called to report that she got her flu shot at work today

## 2022-06-29 ENCOUNTER — Other Ambulatory Visit (HOSPITAL_BASED_OUTPATIENT_CLINIC_OR_DEPARTMENT_OTHER): Payer: Self-pay

## 2022-06-29 MED ORDER — FLUARIX QUADRIVALENT 0.5 ML IM SUSY
PREFILLED_SYRINGE | INTRAMUSCULAR | 0 refills | Status: DC
  Filled 2022-06-29: qty 0.5, 1d supply, fill #0

## 2022-07-09 ENCOUNTER — Ambulatory Visit
Admission: RE | Admit: 2022-07-09 | Discharge: 2022-07-09 | Disposition: A | Discharge status: Home / Self Care | Payer: BC Managed Care – PPO | Source: Ambulatory Visit | Attending: Family Medicine | Admitting: Family Medicine

## 2022-07-09 DIAGNOSIS — Z0000 Encounter for general adult medical examination without abnormal findings: Secondary | ICD-10-CM | POA: Insufficient documentation

## 2022-07-09 DIAGNOSIS — Z1231 Encounter for screening mammogram for malignant neoplasm of breast: Secondary | ICD-10-CM | POA: Insufficient documentation

## 2022-09-06 NOTE — Progress Notes (Signed)
Name: Kathy Benjamin   MRN: [id number]    DOB: Apr 25, 1956   Date:09/07/2022       Progress Note  Subjective  Chief Complaint  Wants Labs  HPI  Other Neutropenia:  stable, last level was 3.3 , previously it was 2.9 No anemia and normal platelets,she would like to have it monitored again today    Pre-diabetes: her A1C went from  5.6% to 5.8%,  5.5%, 5.6 % last time 5.5 %  She denies polyphagia, polydipsia or polyuria. She likes sweets but has been cutting back. She stopped drinking regular sodas but has been drinking sweet tea but diluted with un sweet tea  OA left knee: She has effusion intermittently. Pain is dull and mild and stable . She walks at work and stands all day. She takes Tylenol prn also using some topical medication, currently Hump cream   Vitamin D deficiency: continue vitamin D  2000 units daily last level was back to normal at 30 , she would like to have it repeated today    Osteopenia: of left femur, she is due for repeat bone density  History of cystocele: she had a hysterectomy, she also has a history of pelvic floor dysfunction but doing well, only has incontinence when she holds for a while and has to rush to void   Family history of thyroid disease: two sisters and brother, she is not sure what type of thyroid disease but would like labs done    Dyslipidemia: continue life style modifications, last LDL was 114  The 10-year ASCVD risk score (Arnett DK, et al., 2019) is: 5.8%   Values used to calculate the score:     Age: 66 years     Sex: Female     Is Non-Hispanic African American: Yes     Diabetic: No     Tobacco smoker: No     Systolic Blood Pressure: 120 mmHg     Is BP treated: No     HDL Cholesterol: 64 mg/dL     Total Cholesterol: 193 mg/dL    Patient Active Problem List   Diagnosis Date Noted   Other neutropenia (HCC) 11/14/2021   Osteopenia 07/02/2018   Bradycardia 05/07/2018   Leucocytosis 05/10/2017   Urge incontinence of urine  05/06/2017   Osteoarthritis of right hip 05/06/2017   Post herpetic neuralgia 08/15/2015   Bunion of left foot 05/02/2015   H/O: hysterectomy 05/02/2015   Edema leg 04/29/2015   Obesity (BMI 30.0-34.9) 04/29/2015   Primary localized osteoarthrosis, lower leg 04/29/2015   Perennial allergic rhinitis with seasonal variation 04/29/2015   Borderline diabetes 04/29/2015   Menopausal symptom 04/29/2015   Vitamin D deficiency 04/29/2015   Abnormal electrocardiogram 04/02/2007    Past Surgical History:  Procedure Laterality Date   ABDOMINAL HYSTERECTOMY     BREAST EXCISIONAL BIOPSY Right 2009   neg   BREAST SURGERY Right [id number]   biopsy   COLONOSCOPY     COLONOSCOPY WITH PROPOFOL N/A 08/05/2015   Procedure: COLONOSCOPY WITH PROPOFOL;  Surgeon: Midge Minium, MD;  Location: [facility];  Service: Endoscopy;  Laterality: N/A;   HERNIA REPAIR     umbilical    Family History  Problem Relation Age of Onset   Diabetes Mother    Hypertension Mother    Stroke Father    Hypertension Father    Cancer Father    Hyperthyroidism Sister    Cancer Brother        prostate   Hyperthyroidism Brother  Kidney disease Sister    Breast cancer Neg Hx     Social History   Tobacco Use   Smoking status: Never   Smokeless tobacco: Never  Substance Use Topics   Alcohol use: No    Alcohol/week: 0.0 standard drinks of alcohol     Current Outpatient Medications:    acetaminophen (TYLENOL) 500 MG tablet, Take 1 tablet (500 mg total) by mouth every 6 (six) hours as needed., Disp: 90 tablet, Rfl: 1   Cholecalciferol (VITAMIN D) 2000 units CAPS, Take 1 capsule by mouth daily., Disp: , Rfl:    fluticasone (FLONASE) 50 MCG/ACT nasal spray, SPRAY 2 SPRAYS INTO EACH NOSTRIL EVERY DAY, Disp: 16 mL, Rfl: 4   loratadine (CLARITIN) 10 MG tablet, TAKE 1 TABLET BY MOUTH TWICE A DAY, Disp: 180 tablet, Rfl: 0   Misc Natural Products (GLUCOSAMINE CHOND CMP DOUBLE PO), Take by mouth., Disp: , Rfl:     Olopatadine HCl (PATADAY) 0.2 % SOLN, Apply to eye., Disp: , Rfl:    triamcinolone cream (KENALOG) 0.1 %, APPLY TO AFFECTED AREA TWICE A DAY, Disp: 45 g, Rfl: 0   diclofenac Sodium (VOLTAREN) 1 % GEL, Apply 4 g topically 4 (four) times daily. (Patient not taking: Reported on 09/07/2022), Disp: 100 g, Rfl: 1  No Known Allergies  I personally reviewed active problem list, medication list, allergies, family history, social history, health maintenance with the patient/caregiver today.   ROS  Ten systems reviewed and is negative except as mentioned in HPI   Objective  Vitals:   09/07/22 0927  BP: 120/74  Pulse: 70  Resp: 16  Temp: 98.1 F (36.7 C)  TempSrc: Oral  SpO2: 99%  Weight: 192 lb 6.4 oz (87.3 kg)  Height: 5' 2.5" (1.588 m)    Body mass index is 34.63 kg/m.  Physical Exam  Constitutional: Patient appears well-developed and well-nourished. Obese  No distress.  HEENT: head atraumatic, normocephalic, pupils equal and reactive to light,, neck supple Cardiovascular: Normal rate, regular rhythm and normal heart sounds.  No murmur heard. Bilateral ankle non pitting  edema. Pulmonary/Chest: Effort normal and breath sounds normal. No respiratory distress. Abdominal: Soft.  There is no tenderness. Psychiatric: Patient has a normal mood and affect. behavior is normal. Judgment and thought content normal.   PHQ2/9:    09/07/2022    9:27 AM 05/16/2022    1:06 PM 11/14/2021    3:24 PM 05/15/2021    7:59 AM 11/14/2020    9:14 AM  Depression screen PHQ 2/9  Decreased Interest 0 0 0 0 0  Down, Depressed, Hopeless 0 0 0 0 0  PHQ - 2 Score 0 0 0 0 0  Altered sleeping 0 0 0    Tired, decreased energy 0 0 0    Change in appetite 0 0 0    Feeling bad or failure about yourself  0 0 0    Trouble concentrating 0 0 0    Moving slowly or fidgety/restless 0 0 0    Suicidal thoughts 0 0 0    PHQ-9 Score 0 0 0    Difficult doing work/chores Not difficult at all Not difficult at all Not  difficult at all      phq 9 is negative   Fall Risk:    09/07/2022    9:27 AM 05/16/2022    1:06 PM 11/14/2021    3:24 PM 05/15/2021    7:59 AM 11/14/2020    9:13 AM  Fall Risk  Falls in the past year? 0 0 0 0 0  Number falls in past yr: 0 0 0 0 0  Injury with Fall? 0 0 0 0 0  Risk for fall due to : No Fall Risks No Fall Risks No Fall Risks    Follow up Falls prevention discussed;Education provided;Falls evaluation completed Falls prevention discussed;Education provided Falls prevention discussed      Assessment & Plan  1. Other neutropenia (HCC)  - CBC with Differential/Platelet  2. Vitamin D deficiency  - VITAMIN D 25 Hydroxy (Vit-D Deficiency, Fractures)  3. Pre-diabetes  Continue life style modifications  4. Encounter for screening for HIV  - HIV Antibody (routine testing w rflx)  5. Family history of thyroid disease  - TSH  6. Primary osteoarthritis of left knee   7. Hypercholesterolemia  Relieved labs , low ASCVD risk   8. Osteopenia of left hip  - DG Bone Density; Future

## 2022-09-07 ENCOUNTER — Encounter: Payer: Self-pay | Admitting: Family Medicine

## 2022-09-07 ENCOUNTER — Ambulatory Visit: Payer: BC Managed Care – PPO | Admitting: Family Medicine

## 2022-09-07 VITALS — BP 120/74 | HR 70 | Temp 98.1°F | Resp 16 | Ht 62.5 in | Wt 192.4 lb

## 2022-09-07 DIAGNOSIS — E559 Vitamin D deficiency, unspecified: Secondary | ICD-10-CM

## 2022-09-07 DIAGNOSIS — D708 Other neutropenia: Secondary | ICD-10-CM | POA: Diagnosis not present

## 2022-09-07 DIAGNOSIS — Z114 Encounter for screening for human immunodeficiency virus [HIV]: Secondary | ICD-10-CM

## 2022-09-07 DIAGNOSIS — Z8349 Family history of other endocrine, nutritional and metabolic diseases: Secondary | ICD-10-CM

## 2022-09-07 DIAGNOSIS — R7303 Prediabetes: Secondary | ICD-10-CM | POA: Diagnosis not present

## 2022-09-07 DIAGNOSIS — E7800 Pure hypercholesterolemia, unspecified: Secondary | ICD-10-CM

## 2022-09-07 DIAGNOSIS — M1712 Unilateral primary osteoarthritis, left knee: Secondary | ICD-10-CM

## 2022-09-07 DIAGNOSIS — M85852 Other specified disorders of bone density and structure, left thigh: Secondary | ICD-10-CM

## 2022-09-11 ENCOUNTER — Telehealth: Payer: Self-pay | Admitting: Family Medicine

## 2022-09-11 LAB — CBC WITH DIFFERENTIAL/PLATELET
Absolute Monocytes: 435 cells/uL (ref 200–950)
Basophils Absolute: 19 cells/uL (ref 0–200)
Basophils Relative: 0.6 %
Eosinophils Absolute: 51 cells/uL (ref 15–500)
Eosinophils Relative: 1.6 %
HCT: 38.2 % (ref 35.0–45.0)
Hemoglobin: 12.5 g/dL (ref 11.7–15.5)
Lymphs Abs: 1610 cells/uL (ref 850–3900)
MCH: 30.1 pg (ref 27.0–33.0)
MCHC: 32.7 g/dL (ref 32.0–36.0)
MCV: 92 fL (ref 80.0–100.0)
MPV: 9.5 fL (ref 7.5–12.5)
Monocytes Relative: 13.6 %
Neutro Abs: 1085 cells/uL — ABNORMAL LOW (ref 1500–7800)
Neutrophils Relative %: 33.9 %
Platelets: 193 10*3/uL (ref 140–400)
RBC: 4.15 10*6/uL (ref 3.80–5.10)
RDW: 12.5 % (ref 11.0–15.0)
Total Lymphocyte: 50.3 %
WBC: 3.2 10*3/uL — ABNORMAL LOW (ref 3.8–10.8)

## 2022-09-11 LAB — HIV ANTIBODY (ROUTINE TESTING W REFLEX): HIV 1&2 Ab, 4th Generation: NONREACTIVE

## 2022-09-11 LAB — VITAMIN D 25 HYDROXY (VIT D DEFICIENCY, FRACTURES): Vit D, 25-Hydroxy: 26 ng/mL — ABNORMAL LOW (ref 30–100)

## 2022-09-11 LAB — TSH: TSH: 1.89 mIU/L (ref 0.40–4.50)

## 2022-09-11 NOTE — Telephone Encounter (Signed)
Copied from CRM [id number]. Topic: General - Inquiry >> Sep 11, 2022 12:11 PM Payton Doughty wrote: Reason for CRM: pt would like cb about her lab results. Pt unable to log into mychart at this time.

## 2022-10-19 ENCOUNTER — Encounter: Payer: Self-pay | Admitting: Internal Medicine

## 2022-10-19 ENCOUNTER — Ambulatory Visit: Payer: BC Managed Care – PPO | Admitting: Internal Medicine

## 2022-10-19 VITALS — BP 118/70 | HR 96 | Temp 98.2°F | Resp 16 | Ht 62.5 in | Wt 190.8 lb

## 2022-10-19 DIAGNOSIS — R0982 Postnasal drip: Secondary | ICD-10-CM | POA: Diagnosis not present

## 2022-10-19 NOTE — Patient Instructions (Addendum)
It was great seeing you today!  Plan discussed at today's visit: -Use nasal saline, then Ryaltris nasal spray 2 sprays on each side twice a day -Continue Claritin at night  Follow up in: as needed  Take care and let us know if you have any questions or concerns prior to your next visit.  Dr. Rosana Berger

## 2022-10-19 NOTE — Progress Notes (Signed)
   Acute Office Visit  Subjective:     Patient ID: Kathy Benjamin, female    DOB: 17-Apr-1956, 67 y.o.   MRN: [id number]  Chief Complaint  Patient presents with   Sinusitis    W/ drainage and cough    HPI Patient is in today for post nasal drip. Symptoms have been going on for about 1 week. Feels like mucus is going down her throat, worse at night. No sore throat. No sinus pain/pressure, fever, cough. Has been taking Mucinex BID, does help.    Review of Systems  Constitutional:  Negative for chills and fever.  HENT:  Negative for congestion, ear pain, sinus pain and sore throat.   Respiratory:  Negative for cough, shortness of breath and wheezing.   Cardiovascular:  Negative for chest pain.        Objective:    BP 118/70   Pulse 96   Temp 98.2 F (36.8 C)   Resp 16   Ht 5' 2.5" (1.588 m)   Wt 190 lb 12.8 oz (86.5 kg)   SpO2 96%   BMI 34.34 kg/m  BP Readings from Last 3 Encounters:  10/19/22 118/70  09/07/22 120/74  05/16/22 94/62   Wt Readings from Last 3 Encounters:  10/19/22 190 lb 12.8 oz (86.5 kg)  09/07/22 192 lb 6.4 oz (87.3 kg)  05/16/22 195 lb 4.8 oz (88.6 kg)      Physical Exam Constitutional:      Appearance: Normal appearance.  HENT:     Head: Normocephalic and atraumatic.     Right Ear: Tympanic membrane, ear canal and external ear normal.     Left Ear: Tympanic membrane, ear canal and external ear normal.     Nose: Nose normal.     Mouth/Throat:     Mouth: Mucous membranes are moist.     Comments: Post nasal drip present  Eyes:     Conjunctiva/sclera: Conjunctivae normal.  Neck:     Comments: No thyromegaly  Cardiovascular:     Rate and Rhythm: Normal rate and regular rhythm.  Pulmonary:     Effort: Pulmonary effort is normal.     Breath sounds: Normal breath sounds.  Musculoskeletal:     Cervical back: No tenderness.  Lymphadenopathy:     Cervical: No cervical adenopathy.  Skin:    General: Skin is warm and dry.   Neurological:     General: No focal deficit present.     Mental Status: She is alert. Mental status is at baseline.  Psychiatric:        Mood and Affect: Mood normal.        Behavior: Behavior normal.     No results found for any visits on 10/19/22.      Assessment & Plan:   1. Post-nasal drip: Was given samples of sinus rinse spray and Ryaltris nasal spray to use 2 sprays on each side twice a day. Continue Claritin at night. Discussed oral steroids, patient will use sprays first and let me know if her symptoms worsen or fail to improve.    Return if symptoms worsen or fail to improve.  Teodora Medici, DO

## 2022-11-16 ENCOUNTER — Ambulatory Visit: Payer: BC Managed Care – PPO | Admitting: Family Medicine

## 2022-11-23 ENCOUNTER — Ambulatory Visit: Payer: BC Managed Care – PPO | Admitting: Family Medicine

## 2022-11-26 ENCOUNTER — Ambulatory Visit: Payer: BC Managed Care – PPO | Admitting: Family Medicine

## 2022-12-31 ENCOUNTER — Ambulatory Visit: Payer: BC Managed Care – PPO | Admitting: Family Medicine

## 2022-12-31 ENCOUNTER — Ambulatory Visit: Payer: Self-pay | Admitting: *Deleted

## 2022-12-31 ENCOUNTER — Encounter: Payer: Self-pay | Admitting: Family Medicine

## 2022-12-31 VITALS — BP 126/74 | HR 62 | Temp 97.9°F | Resp 16 | Ht 62.5 in | Wt 193.9 lb

## 2022-12-31 DIAGNOSIS — R42 Dizziness and giddiness: Secondary | ICD-10-CM | POA: Diagnosis not present

## 2022-12-31 MED ORDER — MECLIZINE HCL 25 MG PO TABS: 12.5000 mg | ORAL_TABLET | Freq: Three times a day (TID) | ORAL | 1 refills | Status: DC | PRN

## 2022-12-31 NOTE — Progress Notes (Signed)
Patient ID: Kathy Benjamin, female    DOB: 1956/02/10, 67 y.o.   MRN: [id number]  PCP: Alba Cory, MD  Chief Complaint  Patient presents with   Dizziness    Last night, "Particle, crystals out of place in my ears"   I've had this before and I have to do special exercises with a therapist.   I can't get in with the ENT dr.   There are 55 people in front of me with him.   "I can't do those exercises by myself.   Dr. Carlynn Purl gives me a pill that helps but they are expired.   Emesis    Subjective:   Kathy Benjamin is a 67 y.o. female, presents to clinic with CC of the following:  HPI  Hx of BPPV episodes Last night started to have episodes with rolling over in bed getting out of bed She has old meclizine Previously consulted with ENT for tx   Patient Active Problem List   Diagnosis Date Noted   Other neutropenia 11/14/2021   Osteopenia 07/02/2018   Bradycardia 05/07/2018   Leucocytosis 05/10/2017   Urge incontinence of urine 05/06/2017   Osteoarthritis of right hip 05/06/2017   Post herpetic neuralgia 08/15/2015   Bunion of left foot 05/02/2015   H/O: hysterectomy 05/02/2015   Edema leg 04/29/2015   Obesity (BMI 30.0-34.9) 04/29/2015   Primary localized osteoarthrosis, lower leg 04/29/2015   Perennial allergic rhinitis with seasonal variation 04/29/2015   Borderline diabetes 04/29/2015   Menopausal symptom 04/29/2015   Vitamin D deficiency 04/29/2015   Abnormal electrocardiogram 04/02/2007      Current Outpatient Medications:    acetaminophen (TYLENOL) 500 MG tablet, Take 1 tablet (500 mg total) by mouth every 6 (six) hours as needed., Disp: 90 tablet, Rfl: 1   Cholecalciferol (VITAMIN D) 2000 units CAPS, Take 1 capsule by mouth daily., Disp: , Rfl:    fluticasone (FLONASE) 50 MCG/ACT nasal spray, SPRAY 2 SPRAYS INTO EACH NOSTRIL EVERY DAY, Disp: 16 mL, Rfl: 4   loratadine (CLARITIN) 10 MG tablet, TAKE 1 TABLET BY MOUTH TWICE A DAY, Disp: 180 tablet,  Rfl: 0   Misc Natural Products (GLUCOSAMINE CHOND CMP DOUBLE PO), Take by mouth., Disp: , Rfl:    triamcinolone cream (KENALOG) 0.1 %, APPLY TO AFFECTED AREA TWICE A DAY, Disp: 45 g, Rfl: 0   meclizine (ANTIVERT) 25 MG tablet, Take 0.5-1 tablets (12.5-25 mg total) by mouth 3 (three) times daily as needed for dizziness or nausea., Disp: 30 tablet, Rfl: 1   Olopatadine HCl (PATADAY) 0.2 % SOLN, Apply to eye. (Patient not taking: Reported on 12/31/2022), Disp: , Rfl:    No Known Allergies   Social History   Tobacco Use   Smoking status: Never   Smokeless tobacco: Never  Vaping Use   Vaping Use: Never used  Substance Use Topics   Alcohol use: No    Alcohol/week: 0.0 standard drinks of alcohol   Drug use: No      Chart Review Today: I personally reviewed active problem list, medication list, allergies, family history, social history, health maintenance, notes from last encounter, lab results, imaging with the patient/caregiver today.   Review of Systems  Constitutional: Negative.   HENT: Negative.    Eyes: Negative.   Respiratory: Negative.    Cardiovascular: Negative.   Gastrointestinal: Negative.   Endocrine: Negative.   Genitourinary: Negative.   Musculoskeletal: Negative.   Skin: Negative.   Allergic/Immunologic: Negative.   Neurological: Negative.  Hematological: Negative.   Psychiatric/Behavioral: Negative.    All other systems reviewed and are negative.      Objective:   Vitals:   12/31/22 1132  BP: 126/74  Pulse: 62  Resp: 16  Temp: 97.9 F (36.6 C)  TempSrc: Oral  SpO2: 99%  Weight: 193 lb 14.4 oz (88 kg)  Height: 5' 2.5" (1.588 m)    Body mass index is 34.9 kg/m.  Physical Exam Vitals and nursing note reviewed.  Constitutional:      Appearance: She is well-developed.  HENT:     Head: Normocephalic and atraumatic.     Right Ear: External ear normal. There is no impacted cerumen.     Left Ear: External ear normal. There is no impacted cerumen.      Nose: Nose normal. No congestion or rhinorrhea.     Mouth/Throat:     Mouth: Mucous membranes are moist.     Pharynx: Oropharynx is clear. No oropharyngeal exudate or posterior oropharyngeal erythema.  Eyes:     General: No scleral icterus.       Right eye: No discharge.        Left eye: No discharge.     Conjunctiva/sclera: Conjunctivae normal.  Neck:     Trachea: No tracheal deviation.  Cardiovascular:     Rate and Rhythm: Normal rate and regular rhythm.  Pulmonary:     Effort: Pulmonary effort is normal. No respiratory distress.     Breath sounds: No stridor.  Musculoskeletal:        General: Normal range of motion.  Skin:    General: Skin is warm and dry.     Findings: No rash.  Neurological:     Mental Status: She is alert.     Motor: No abnormal muscle tone.     Coordination: Coordination normal.     Gait: Gait normal.     Comments: MENTAL STATUS: AAOx3, memory intact, fund of knowledge appropriate  LANG/SPEECH: Naming and repetition intact, fluent, no dysarthria, follows 3-step commands, answers questions appropriately  No data recorded   CRANIAL NERVES:   II: Pupils equal and reactive, no RAPD   III, IV, VI: EOM intact, no gaze preference or deviation, no nystagmus.   V: normal sensation in V1, V2, and V3 segments bilaterally   VII: no asymmetry, no nasolabial fold flattening   VIII: normal hearing to speech   IX, X: normal palatal elevation, no uvular deviation   XI: 5/5 head turn and 5/5 shoulder shrug bilaterally   XII: midline tongue protrusion  MOTOR:  5/5 bilateral grip strength 5/5 strength dorsiflexion/plantarflexion b/l  SENSORY:  Normal to light touch Romberg absent  COORD: Normal finger to nose and heel to shin, no tremor, no dysmetria  STATION: normal stance, no truncal ataxia  GAIT: Normal, able to walk heel-toe    Psychiatric:        Mood and Affect: Mood normal.        Behavior: Behavior normal.      Results for orders placed  or performed in visit on 09/07/22  CBC with Differential/Platelet  Result Value Ref Range   WBC 3.2 (L) 3.8 - 10.8 Thousand/uL   RBC 4.15 3.80 - 5.10 Million/uL   Hemoglobin 12.5 11.7 - 15.5 g/dL   HCT 16.1 09.6 - 04.5 %   MCV 92.0 80.0 - 100.0 fL   MCH 30.1 27.0 - 33.0 pg   MCHC 32.7 32.0 - 36.0 g/dL   RDW 40.9 81.1 - 91.4 %  Platelets 193 140 - 400 Thousand/uL   MPV 9.5 7.5 - 12.5 fL   Neutro Abs 1,085 (L) 1,500 - 7,800 cells/uL   Lymphs Abs 1,610 850 - 3,900 cells/uL   Absolute Monocytes 435 200 - 950 cells/uL   Eosinophils Absolute 51 15 - 500 cells/uL   Basophils Absolute 19 0 - 200 cells/uL   Neutrophils Relative % 33.9 %   Total Lymphocyte 50.3 %   Monocytes Relative 13.6 %   Eosinophils Relative 1.6 %   Basophils Relative 0.6 %  VITAMIN D 25 Hydroxy (Vit-D Deficiency, Fractures)  Result Value Ref Range   Vit D, 25-Hydroxy 26 (L) 30 - 100 ng/mL  TSH  Result Value Ref Range   TSH 1.89 0.40 - 4.50 mIU/L  HIV Antibody (routine testing w rflx)  Result Value Ref Range   HIV 1&2 Ab, 4th Generation NON-REACTIVE NON-REACTIVE       Assessment & Plan:     ICD-10-CM   1. Vertigo  R42 meclizine (ANTIVERT) 25 MG tablet   consistent with her hx of BPPV, reproducible, no focal neurological deficit, refill meclizine, can try epley maneuver at home, she has f/up ENT          Danelle Berry, PA-C 12/31/22 11:58 AM

## 2022-12-31 NOTE — Telephone Encounter (Signed)
Message from Kathy Benjamin sent at 12/31/2022  8:37 AM EDT  Summary: vomiting   Patient called stated she is dizzy room spinning, she is vomiting and she can't keep down food          Call History   Type Contact Phone/Fax User  12/31/2022 08:36 AM EDT Phone (Incoming) Quintero, Kalima Schrade (Self) [phone] Judie Petit) Kathy Benjamin   Reason for Disposition  [1] MODERATE dizziness (e.g., vertigo; feels very unsteady, interferes with normal activities) AND [2] has NOT been evaluated by doctor (or NP/PA) for this  Answer Assessment - Initial Assessment Questions 1. DESCRIPTION: "Describe your dizziness."     I've seen an ENT for this in the past.   I go to this place and do this exercises with my head.    I can't see me.   There are 55 people in front of me at the ENT dr.  2. VERTIGO: "Do you feel like either you or the room is spinning or tilting?"      I have vertigo and where the crystals in my ears get out of place.   I can't do those exercises myself.    Dr. Carlynn Purl gave me some pills in the past that helped me. 3. LIGHTHEADED: "Do you feel lightheaded?" (e.g., somewhat faint, woozy, weak upon standing)     It started last night the spinning.   I vomited last night.   I can't keep anything down.    4. SEVERITY: "How bad is it?"  "Can you walk?"   - MILD: Feels slightly dizzy and unsteady, but is walking normally.   - MODERATE: Feels unsteady when walking, but not falling; interferes with normal activities (e.g., school, work).   - SEVERE: Unable to walk without falling, or requires assistance to walk without falling.     Sometimes I can walk but I hold onto things.    5. ONSET:  "When did the dizziness begin?"     Last night 6. AGGRAVATING FACTORS: "Does anything make it worse?" (e.g., standing, change in head position)     Movement 7. CAUSE: "What do you think is causing the dizziness?"     I have the crystals in my ears out of place 8. RECURRENT SYMPTOM: "Have you had dizziness  before?" If Yes, ask: "When was the last time?" "What happened that time?"     Yes 9. OTHER SYMPTOMS: "Do you have any other symptoms?" (e.g., headache, weakness, numbness, vomiting, earache)     No 10. PREGNANCY: "Is there any chance you are pregnant?" "When was your last menstrual period?"       N/A  Protocols used: Dizziness - Vertigo-A-AH

## 2022-12-31 NOTE — Patient Instructions (Addendum)
How to Perform the Epley Maneuver The Epley maneuver is an exercise that relieves symptoms of vertigo. Vertigo is the feeling that you or your surroundings are moving when they are not. When you feel vertigo, you may feel like the room is spinning and may have trouble walking. The Epley maneuver is used for a type of vertigo caused by a calcium deposit in a part of the inner ear. The maneuver involves changing head positions to help the deposit move out of the area. You can do this maneuver at home whenever you have symptoms of vertigo. You can repeat it in 24 hours if your vertigo has not gone away. Even though the Epley maneuver may relieve your vertigo for a few weeks, it is possible that your symptoms will return. This maneuver relieves vertigo, but it does not relieve dizziness. What are the risks? If it is done correctly, the Epley maneuver is considered safe. Sometimes it can lead to dizziness or nausea that goes away after a short time. If you develop other symptoms--such as changes in vision, weakness, or numbness--stop doing the maneuver and call your health care provider. Supplies needed: A bed or table. A pillow. How to do the Epley maneuver     Sit on the edge of a bed or table with your back straight and your legs extended or hanging over the edge of the bed or table. Turn your head halfway toward the affected ear or side as told by your health care provider. Lie backward quickly with your head turned until you are lying flat on your back. Your head should dangle (head-hanging position). You may want to position a pillow under your shoulders. Hold this position for at least 30 seconds. If you feel dizzy or have symptoms of vertigo, continue to hold the position until the symptoms stop. Turn your head to the opposite direction until your unaffected ear is facing down. Your head should continue to dangle. Hold this position for at least 30 seconds. If you feel dizzy or have symptoms of  vertigo, continue to hold the position until the symptoms stop. Turn your whole body to the same side as your head so that you are positioned on your side. Your head will now be nearly facedown and no longer needs to dangle. Hold for at least 30 seconds. If you feel dizzy or have symptoms of vertigo, continue to hold the position until the symptoms stop. Sit back up. You can repeat the maneuver in 24 hours if your vertigo does not go away. Follow these instructions at home: For 24 hours after doing the Epley maneuver: Keep your head in an upright position. When lying down to sleep or rest, keep your head raised (elevated) with two or more pillows. Avoid excessive neck movements. Activity Do not drive or use machinery if you feel dizzy. After doing the Epley maneuver, return to your normal activities as told by your health care provider. Ask your health care provider what activities are safe for you. General instructions Drink enough fluid to keep your urine pale yellow. Do not drink alcohol. Take over-the-counter and prescription medicines only as told by your health care provider. Keep all follow-up visits. This is important. Preventing vertigo symptoms Ask your health care provider if there is anything you should do at home to prevent vertigo. He or she may recommend that you: Keep your head elevated with two or more pillows while you sleep. Do not sleep on the side of your affected ear. Get   up slowly from bed. Avoid sudden movements during the day. Avoid extreme head positions or movement, such as looking up or bending over. Contact a health care provider if: Your vertigo gets worse. You have other symptoms, including: Nausea. Vomiting. Headache. Get help right away if you: Have vision changes. Have a headache or neck pain that is severe or getting worse. Cannot stop vomiting. Have new numbness or weakness in any part of your body. These symptoms may represent a serious problem  that is an emergency. Do not wait to see if the symptoms will go away. Get medical help right away. Call your local emergency services (911 in the [place] Do not drive yourself to the hospital. Summary Vertigo is the feeling that you or your surroundings are moving when they are not. The Epley maneuver is an exercise that relieves symptoms of vertigo. If the Epley maneuver is done correctly, it is considered safe. This information is not intended to replace advice given to you by your health care provider. Make sure you discuss any questions you have with your health care provider. Document Revised: 08/03/2020 Document Reviewed: 08/03/2020 Elsevier Patient Education  2023 [employer] Inc.   Benign Positional Vertigo Vertigo is the feeling that you or your surroundings are moving when they are not. Benign positional vertigo is the most common form of vertigo. This is usually a harmless condition (benign). This condition is positional. This means that symptoms are triggered by certain movements and positions. This condition can be dangerous if it occurs while you are doing something that could cause harm to yourself or others. This includes activities such as driving or operating machinery. What are the causes? The inner ear has fluid-filled canals that help your brain sense movement and balance. When the fluid moves, the brain receives messages about your body's position. With benign positional vertigo, calcium crystals in the inner ear break free and disturb the inner ear area. This causes your brain to receive confusing messages about your body's position. What increases the risk? You are more likely to develop this condition if: You are a woman. You are 50 years of age or older. You have recently had a head injury. You have an inner ear disease. What are the signs or symptoms? Symptoms of this condition usually happen when you move your head or your eyes in different directions. Symptoms may  start suddenly and usually last for less than a minute. They include: Loss of balance and falling. Feeling like you are spinning or moving. Feeling like your surroundings are spinning or moving. Nausea and vomiting. Blurred vision. Dizziness. Involuntary eye movement (nystagmus). Symptoms can be mild and cause only minor problems, or they can be severe and interfere with daily life. Episodes of benign positional vertigo may return (recur) over time. Symptoms may also improve over time. How is this diagnosed? This condition may be diagnosed based on: Your medical history. A physical exam of the head, neck, and ears. Positional tests to check for or stimulate vertigo. You may be asked to turn your head and change positions, such as going from sitting to lying down. A health care provider will watch for symptoms of vertigo. You may be referred to a health care provider who specializes in ear, nose, and throat problems (ENT or otolaryngologist) or a provider who specializes in disorders of the nervous system (neurologist). How is this treated?  This condition may be treated in a session in which your health care provider moves your head in specific positions   to help the displaced crystals in your inner ear move. Treatment for this condition may take several sessions. Surgery may be needed in severe cases, but this is rare. In some cases, benign positional vertigo may resolve on its own in 2-4 weeks. Follow these instructions at home: Safety Move slowly. Avoid sudden body or head movements or certain positions, as told by your health care provider. Avoid driving or operating machinery until your health care provider says it is safe. Avoid doing any tasks that would be dangerous to you or others if vertigo occurs. If you have trouble walking or keeping your balance, try using a cane for stability. If you feel dizzy or unstable, sit down right away. Return to your normal activities as told by your  health care provider. Ask your health care provider what activities are safe for you. General instructions Take over-the-counter and prescription medicines only as told by your health care provider. Drink enough fluid to keep your urine pale yellow. Keep all follow-up visits. This is important. Contact a health care provider if: You have a fever. Your condition gets worse or you develop new symptoms. Your family or friends notice any behavioral changes. You have nausea or vomiting that gets worse. You have numbness or a prickling and tingling sensation. Get help right away if you: Have difficulty speaking or moving. Are always dizzy or faint. Develop severe headaches. Have weakness in your legs or arms. Have changes in your hearing or vision. Develop a stiff neck. Develop sensitivity to light. These symptoms may represent a serious problem that is an emergency. Do not wait to see if the symptoms will go away. Get medical help right away. Call your local emergency services (911 in the [place] Do not drive yourself to the hospital. Summary Vertigo is the feeling that you or your surroundings are moving when they are not. Benign positional vertigo is the most common form of vertigo. This condition is caused by calcium crystals in the inner ear that become displaced. This causes a disturbance in an area of the inner ear that helps your brain sense movement and balance. Symptoms include loss of balance and falling, feeling that you or your surroundings are moving, nausea and vomiting, and blurred vision. This condition can be diagnosed based on symptoms, a physical exam, and positional tests. Follow safety instructions as told by your health care provider and keep all follow-up visits. This is important. This information is not intended to replace advice given to you by your health care provider. Make sure you discuss any questions you have with your health care provider. Document Revised:  08/03/2020 Document Reviewed: 08/03/2020 Elsevier Patient Education  2023 Elsevier Inc.  

## 2022-12-31 NOTE — Telephone Encounter (Signed)
  Chief Complaint: Dizzy and vomiting started last night Symptoms: "Particle, crystals out of place in my ears"   I've had this before and I have to do special exercises with a therapist.   I can't get in with the ENT dr.   There are 55 people in front of me with him.   "I can't do those exercises by myself.   Dr. Carlynn Purl gives me a pill that helps but they are expired. Frequency: Dizziness with the vomiting started last night Pertinent Negatives: Patient denies other URI symptoms.   Disposition: [] ED /[] Urgent Care (no appt availability in office) / [x] Appointment(In office/virtual)/ []  [place] [facility]/ [] Home Care/ [] Refused Recommended Disposition /[] [facility]/ []  Follow-up with PCP Additional Notes: Appt made for today with Danelle Berry, PA for 11:40.   Pt. Has someone that can drive her in.   I instructed her not to drive.   She was agreeable to this plan.

## 2023-01-07 DIAGNOSIS — R42 Dizziness and giddiness: Secondary | ICD-10-CM | POA: Diagnosis not present

## 2023-01-18 ENCOUNTER — Other Ambulatory Visit: Payer: Self-pay | Admitting: Family Medicine

## 2023-01-21 NOTE — Telephone Encounter (Signed)
Requested medication (s) are due for refill today: yes  Requested medication (s) are on the active medication list: yes  Last refill:  10/05/22  Future visit scheduled: yes  Notes to clinic:  Unable to refill per protocol, cannot delegate.       Requested Prescriptions  Pending Prescriptions Disp Refills   triamcinolone cream (KENALOG) 0.1 % [Pharmacy Med Name: TRIAMCINOLONE 0.1% CREAM] 30 g     Sig: APPLY TO AFFECTED AREA TWICE A DAY     Not Delegated - Dermatology:  Corticosteroids Failed - 01/18/2023  7:06 PM      Failed - This refill cannot be delegated      Passed - Valid encounter within last 12 months    Recent Outpatient Visits           3 weeks ago Vertigo   [facility] Danelle Berry, PA-C   3 months ago Post-nasal drip   [facility] Margarita Mail, DO   4 months ago Other neutropenia [facility])   [facility] Alba Cory, MD   8 months ago Well adult exam   [facility] Alba Cory, MD   1 year ago Other neutropenia [facility])   [facility] Alba Cory, MD       Future Appointments             In 4 months Carlynn Purl, Danna Hefty, MD  [facility]   In 7 months Alba Cory, MD [facility]

## 2023-04-08 ENCOUNTER — Encounter: Payer: Self-pay | Admitting: Nurse Practitioner

## 2023-04-08 ENCOUNTER — Ambulatory Visit: Payer: Self-pay

## 2023-04-08 ENCOUNTER — Ambulatory Visit (INDEPENDENT_AMBULATORY_CARE_PROVIDER_SITE_OTHER): Payer: PRIVATE HEALTH INSURANCE | Admitting: Nurse Practitioner

## 2023-04-08 ENCOUNTER — Other Ambulatory Visit: Payer: Self-pay

## 2023-04-08 VITALS — BP 134/72 | HR 91 | Temp 98.1°F | Resp 16 | Ht 62.5 in | Wt 195.5 lb

## 2023-04-08 DIAGNOSIS — M25561 Pain in right knee: Secondary | ICD-10-CM | POA: Diagnosis not present

## 2023-04-08 NOTE — Telephone Encounter (Signed)
  Chief Complaint: knee swelling and pain  Symptoms: limping  Frequency: Friday Pertinent Negatives: Patient denies warmth redness Disposition: [] ED /[] Urgent Care (no appt availability in office) / [x] Appointment(In office/virtual)/ []  [place] [facility]/ [] Home Care/ [] Refused Recommended Disposition /[] [facility]/ []  Follow-up with PCP Additional Notes: can pt try a ACE wrap- not sure if any on office to offer Reason for Disposition . MILD or MODERATE swelling (e.g., can't move joint normally, can't do usual activities) (Exceptions: Itchy, localized swelling; swelling is chronic.)  Answer Assessment - Initial Assessment Questions 1. LOCATION: "Where is the swelling located?"  (e.g., left, right, both knees)     Right knee 2. ONSET: "When did the swelling start?" "Does it come and go, or is it there all the time?"     Friday 3. SWELLING: "How bad is the swelling?" Or, "How large is it?" (e.g., mild, moderate, severe; size of localized swelling)    - NONE: No joint swelling.   - LOCALIZED: Localized; small area of puffy or swollen skin (e.g., insect bite, skin irritation).   - MILD: Joint looks or feels mildly swollen or puffy.   - MODERATE: Swollen; interferes with normal activities (e.g., work or school); can't move joint normally (bend and straighten completely); may be limping.   - SEVERE: Very swollen; can't move swollen joint at all; limping a lot or unable to walk.     moderate 4. PAIN: "Is there any pain?" If Yes, ask: "How bad is it?" (Scale 1-10; or mild, moderate, severe)   - NONE (0): no pain.   - MILD (1-3): doesn't interfere with normal activities.    - MODERATE (4-7): interferes with normal activities (e.g., work or school) or awakens from sleep, limping.    - SEVERE (8-10): excruciating pain, unable to do any normal activities, unable to walk.      moderate 5. SETTING: "Has there been any recent work, exercise or other activity that involved that part of  the body?"      no 6. AGGRAVATING FACTORS: "What makes the knee swelling worse?" (e.g., walking, climbing stairs, running)     walking 7. ASSOCIATED SYMPTOMS: "Is there any pain or redness?"     pain  Protocols used: Knee Swelling-A-AH

## 2023-04-08 NOTE — Progress Notes (Signed)
BP 134/72   Pulse 91   Temp 98.1 F (36.7 C) (Oral)   Resp 16   Ht 5' 2.5" (1.588 m)   Wt 195 lb 8 oz (88.7 kg)   SpO2 96%   BMI 35.19 kg/m    Subjective:    Patient ID: Kathy Benjamin, female    DOB: 1955-12-25, 67 y.o.   MRN: [id number]  HPI: Kathy Benjamin is a 67 y.o. female  Chief Complaint  Patient presents with   Leg Swelling    Right leg   Right knee swelling/pain: She reports few days ago she noticed right knee pain and swelling.  She denies any trauma.  She denies any fever.  Right knee does feel warm to the touch.  Recommend patient go to [facility] walk-in.  Patient states she is getting go right now.  Relevant past medical, surgical, family and social history reviewed and updated as indicated. Interim medical history since our last visit reviewed. Allergies and medications reviewed and updated.  Review of Systems  Constitutional: Negative for fever or weight change.  Respiratory: Negative for cough and shortness of breath.   Cardiovascular: Negative for chest pain or palpitations.  Gastrointestinal: Negative for abdominal pain, no bowel changes.  Musculoskeletal: positive for gait problem and right knee joint swelling.  Skin: Negative for rash.  Neurological: Negative for dizziness or headache.  No other specific complaints in a complete review of systems (except as listed in HPI above).      Objective:    BP 134/72   Pulse 91   Temp 98.1 F (36.7 C) (Oral)   Resp 16   Ht 5' 2.5" (1.588 m)   Wt 195 lb 8 oz (88.7 kg)   SpO2 96%   BMI 35.19 kg/m   Wt Readings from Last 3 Encounters:  04/08/23 195 lb 8 oz (88.7 kg)  12/31/22 193 lb 14.4 oz (88 kg)  10/19/22 190 lb 12.8 oz (86.5 kg)    Physical Exam  Constitutional: Patient appears well-developed and well-nourished. Obese  No distress.  HEENT: head atraumatic, normocephalic, pupils equal and reactive to light, neck supple, throat within normal limits Cardiovascular: Normal rate,  regular rhythm and normal heart sounds.  No murmur heard. No BLE edema. Pulmonary/Chest: Effort normal and breath sounds normal. No respiratory distress. Abdominal: Soft.  There is no tenderness. MSK: right knee swelling and warmth Psychiatric: Patient has a normal mood and affect. behavior is normal. Judgment and thought content normal.   Results for orders placed or performed in visit on 09/07/22  CBC with Differential/Platelet  Result Value Ref Range   WBC 3.2 (L) 3.8 - 10.8 Thousand/uL   RBC 4.15 3.80 - 5.10 Million/uL   Hemoglobin 12.5 11.7 - 15.5 g/dL   HCT 40.9 81.1 - 91.4 %   MCV 92.0 80.0 - 100.0 fL   MCH 30.1 27.0 - 33.0 pg   MCHC 32.7 32.0 - 36.0 g/dL   RDW 78.2 95.6 - 21.3 %   Platelets 193 140 - 400 Thousand/uL   MPV 9.5 7.5 - 12.5 fL   Neutro Abs 1,085 (L) 1,500 - 7,800 cells/uL   Lymphs Abs 1,610 850 - 3,900 cells/uL   Absolute Monocytes 435 200 - 950 cells/uL   Eosinophils Absolute 51 15 - 500 cells/uL   Basophils Absolute 19 0 - 200 cells/uL   Neutrophils Relative % 33.9 %   Total Lymphocyte 50.3 %   Monocytes Relative 13.6 %   Eosinophils Relative 1.6 %  Basophils Relative 0.6 %  VITAMIN D 25 Hydroxy (Vit-D Deficiency, Fractures)  Result Value Ref Range   Vit D, 25-Hydroxy 26 (L) 30 - 100 ng/mL  TSH  Result Value Ref Range   TSH 1.89 0.40 - 4.50 mIU/L  HIV Antibody (routine testing w rflx)  Result Value Ref Range   HIV 1&2 Ab, 4th Generation NON-REACTIVE NON-REACTIVE      Assessment & Plan:   Problem List Items Addressed This Visit   None Visit Diagnoses     Acute pain of right knee    -  Primary   discussed options with patient, she is going to go to [facility] walk [facility].        Follow up plan: Return if symptoms worsen or fail to improve.

## 2023-05-21 NOTE — Progress Notes (Signed)
Name: Kathy Benjamin   MRN: [id number]    DOB: 01/19/1956   Date:05/22/2023       Progress Note  Subjective  Chief Complaint  Annual Exam  HPI  Patient presents for annual CPE.  Diet: cutting down on portion, eats a balanced diet  Exercise: she is doing a stationary pedal daily  Last Eye Exam: up to date - next month  Last Dental Exam: up to date - every 6 months   Flowsheet Row Office Visit from 04/08/2023 in [facility]  AUDIT-C Score 0      Depression: Phq 9 is  negative    05/22/2023    7:44 AM 04/08/2023    1:34 PM 12/31/2022   11:32 AM 10/19/2022    1:13 PM 09/07/2022    9:27 AM  Depression screen PHQ 2/9  Decreased Interest 0 0 0 0 0  Down, Depressed, Hopeless 0 0 0 0 0  PHQ - 2 Score 0 0 0 0 0  Altered sleeping 0  0 0 0  Tired, decreased energy 0  0 0 0  Change in appetite 0  0 0 0  Feeling bad or failure about yourself  0  0 0 0  Trouble concentrating 0  0 0 0  Moving slowly or fidgety/restless 0  0 0 0  Suicidal thoughts 0  0 0 0  PHQ-9 Score 0  0 0 0  Difficult doing work/chores   Not difficult at all Not difficult at all Not difficult at all   Hypertension: BP Readings from Last 3 Encounters:  05/22/23 120/70  04/08/23 134/72  12/31/22 126/74   Obesity: Wt Readings from Last 3 Encounters:  05/22/23 190 lb (86.2 kg)  04/08/23 195 lb 8 oz (88.7 kg)  12/31/22 193 lb 14.4 oz (88 kg)   BMI Readings from Last 3 Encounters:  05/22/23 33.66 kg/m  04/08/23 35.19 kg/m  12/31/22 34.90 kg/m     Vaccines:   RSV: discussed with patient  Tdap: up to date Shingrix: up to date Pneumonia: today  Flu: 2023, she will get it at work  COVID-19: up to date   Hep C Screening: 04/16/13 STD testing and prevention (HIV/chl/gon/syphilis): 09/07/22 Intimate partner violence: negative screen  Sexual History : not sexually active  Menstrual History/LMP/Abnormal Bleeding: s/p hysterectomy  Discussed importance of follow up if any  post-menopausal bleeding: N/A Incontinence Symptoms: negative for symptoms   Breast cancer:  - Last Mammogram: 07/09/22 - BRCA gene screening: N/A  Osteoporosis Prevention : Discussed high calcium and vitamin D supplementation, weight bearing exercises Bone density: she is due for it now   Cervical cancer screening: N/A  Skin cancer: Discussed monitoring for atypical lesions  Colorectal cancer: 08/05/15   Lung cancer:  Low Dose CT Chest recommended if Age 55-80 years, 20 pack-year currently smoking OR have quit w/in 15years. Patient does not qualify for screen   ECG: 05/07/18  Advanced Care Planning: A voluntary discussion about advance care planning including the explanation and discussion of advance directives.  Discussed health care proxy and Living will, and the patient was able to identify a health care proxy as sister -Kathy Benjamin.  Patient does not have a living will and power of attorney of health care   Lipids: Lab Results  Component Value Date   CHOL 193 05/16/2022   CHOL 188 05/15/2021   CHOL 189 05/13/2020   Lab Results  Component Value Date   HDL 64 05/16/2022   HDL  59 05/15/2021   HDL 62 05/13/2020   Lab Results  Component Value Date   LDLCALC 114 (H) 05/16/2022   LDLCALC 113 (H) 05/15/2021   LDLCALC 112 (H) 05/13/2020   Lab Results  Component Value Date   TRIG 60 05/16/2022   TRIG 75 05/15/2021   TRIG 61 05/13/2020   Lab Results  Component Value Date   CHOLHDL 3.0 05/16/2022   CHOLHDL 3.2 05/15/2021   CHOLHDL 3.0 05/13/2020   No results found for: "LDLDIRECT"  Glucose: Glucose, Bld  Date Value Ref Range Status  05/16/2022 74 65 - 99 mg/dL Final    Comment:    .            Fasting reference interval .   05/15/2021 76 65 - 99 mg/dL Final    Comment:    .            Fasting reference interval .   05/13/2020 81 65 - 99 mg/dL Final    Comment:    .            Fasting reference interval .     Patient Active Problem List   Diagnosis  Date Noted   Other neutropenia (HCC) 11/14/2021   Osteopenia 07/02/2018   Bradycardia 05/07/2018   Leucocytosis 05/10/2017   Urge incontinence of urine 05/06/2017   Osteoarthritis of right hip 05/06/2017   Post herpetic neuralgia 08/15/2015   Bunion of left foot 05/02/2015   H/O: hysterectomy 05/02/2015   Edema leg 04/29/2015   Obesity (BMI 30.0-34.9) 04/29/2015   Primary localized osteoarthrosis, lower leg 04/29/2015   Perennial allergic rhinitis with seasonal variation 04/29/2015   Borderline diabetes 04/29/2015   Menopausal symptom 04/29/2015   Vitamin D deficiency 04/29/2015   Abnormal electrocardiogram 04/02/2007    Past Surgical History:  Procedure Laterality Date   ABDOMINAL HYSTERECTOMY     BREAST EXCISIONAL BIOPSY Right 2009   neg   BREAST SURGERY Right [id number]   biopsy   COLONOSCOPY     COLONOSCOPY WITH PROPOFOL N/A 08/05/2015   Procedure: COLONOSCOPY WITH PROPOFOL;  Surgeon: Midge Minium, MD;  Location[facility];  Service: Endoscopy;  Laterality: N/A;   HERNIA REPAIR     umbilical    Family History  Problem Relation Age of Onset   Diabetes Mother    Hypertension Mother    Stroke Father    Hypertension Father    Cancer Father    Hyperthyroidism Sister    Cancer Brother        prostate   Hyperthyroidism Brother    Kidney disease Sister    Breast cancer Neg Hx     Social History   Socioeconomic History   Marital status: Single    Spouse name: Not on file   Number of children: 0   Years of education: Not on file   Highest education level: High school graduate  Occupational History   Not on file  Tobacco Use   Smoking status: Never   Smokeless tobacco: Never  Vaping Use   Vaping status: Never Used  Substance and Sexual Activity   Alcohol use: No    Alcohol/week: 0.0 standard drinks of alcohol   Drug use: No   Sexual activity: Not Currently  Other Topics Concern   Not on file  Social History Narrative   Not working since March 31  st 2020 because of COVID-19 may be re-hired after the Summer    Social Determinants of [employer]  Strain: Low Risk  (05/22/2023)   Overall Financial Resource Strain (CARDIA)    Difficulty of Paying Living Expenses: Not hard at all  Food Insecurity: No Food Insecurity (05/22/2023)   Hunger Vital Sign    Worried About Running Out of Food in the Last Year: Never true    Ran Out of Food in the Last Year: Never true  Transportation Needs: No Transportation Needs (05/22/2023)   PRAPARE - Administrator, Civil Service (Medical): No    Lack of Transportation (Non-Medical): No  Physical Activity: Sufficiently Active (05/22/2023)   Exercise Vital Sign    Days of Exercise per Week: 5 days    Minutes of Exercise per Session: 30 min  Stress: No Stress Concern Present (05/22/2023)   [employer] of Occupational Health - Occupational Stress Questionnaire    Feeling of Stress : Not at all  Social Connections: Moderately Integrated (05/16/2022)   Social Connection and Isolation Panel [NHANES]    Frequency of Communication with Friends and Family: More than three times a week    Frequency of Social Gatherings with Friends and Family: More than three times a week    Attends Religious Services: 1 to 4 times per year    Active Member of [employer] or Organizations: Yes    Attends Banker Meetings: More than 4 times per year    Marital Status: Never married  Intimate Partner Violence: Not At Risk (05/22/2023)   Humiliation, Afraid, Rape, and Kick questionnaire    Fear of Current or Ex-Partner: No    Emotionally Abused: No    Physically Abused: No    Sexually Abused: No     Current Outpatient Medications:    acetaminophen (TYLENOL) 500 MG tablet, Take 1 tablet (500 mg total) by mouth every 6 (six) hours as needed., Disp: 90 tablet, Rfl: 1   Cholecalciferol (VITAMIN D) 2000 units CAPS, Take 1 capsule by mouth daily., Disp: , Rfl:    fluticasone (FLONASE) 50 MCG/ACT nasal  spray, SPRAY 2 SPRAYS INTO EACH NOSTRIL EVERY DAY, Disp: 16 mL, Rfl: 4   loratadine (CLARITIN) 10 MG tablet, TAKE 1 TABLET BY MOUTH TWICE A DAY, Disp: 180 tablet, Rfl: 0   meclizine (ANTIVERT) 25 MG tablet, Take 0.5-1 tablets (12.5-25 mg total) by mouth 3 (three) times daily as needed for dizziness or nausea., Disp: 30 tablet, Rfl: 1   Misc Natural Products (GLUCOSAMINE CHOND CMP DOUBLE PO), Take by mouth., Disp: , Rfl:    triamcinolone cream (KENALOG) 0.1 %, APPLY TO AFFECTED AREA TWICE A DAY, Disp: 45 g, Rfl: 0   Olopatadine HCl (PATADAY) 0.2 % SOLN, Apply to eye. (Patient not taking: Reported on 12/31/2022), Disp: , Rfl:   No Known Allergies   ROS  Constitutional: Negative for fever , positive for mild weight change.  Respiratory: Negative for cough and shortness of breath.   Cardiovascular: Negative for chest pain or palpitations.  Gastrointestinal: Negative for abdominal pain, no bowel changes.  Musculoskeletal: positive  for gait problem and bilateral knee joint swelling.  Skin: Negative for rash.  Neurological: Negative for dizziness or headache.  No other specific complaints in a complete review of systems (except as listed in HPI above).   Objective  Vitals:   05/22/23 0744  BP: 120/70  Pulse: 76  Resp: 16  SpO2: 97%  Weight: 190 lb (86.2 kg)  Height: 5\' 3"  (1.6 m)    Body mass index is 33.66 kg/m.  Physical Exam  Constitutional: Patient appears  well-developed and well-nourished. No distress.  HENT: Head: Normocephalic and atraumatic. Ears: B TMs ok, no erythema or effusion; Nose: Nose normal. Mouth/Throat: Oropharynx is clear and moist. No oropharyngeal exudate.  Eyes: Conjunctivae and EOM are normal. Pupils are equal, round, and reactive to light. No scleral icterus.  Neck: Normal range of motion. Neck supple. No JVD present. No thyromegaly present.  Cardiovascular: Normal rate, regular rhythm and normal heart sounds.  No murmur heard. Non pitting  BLE  edema. Pulmonary/Chest: Effort normal and breath sounds normal. No respiratory distress. Abdominal: Soft. Bowel sounds are normal, no distension. There is no tenderness. no masses Breast: no lumps or masses, no nipple discharge or rashes FEMALE GENITALIA:  Not done  RECTAL: not done  Musculoskeletal:compression stockings up to her hip on right side, effusion both knees  Neurological: he is alert and oriented to person, place, and time. No cranial nerve deficit. Coordination, balance, strength, speech and gait are normal.  Skin: Skin is warm and dry. No rash noted. No erythema.  Psychiatric: Patient has a normal mood and affect. behavior is normal. Judgment and thought content normal.    Fall Risk:    05/22/2023    7:44 AM 04/08/2023    1:34 PM 12/31/2022   11:31 AM 10/19/2022    1:13 PM 09/07/2022    9:27 AM  Fall Risk   Falls in the past year? 0 0 0 0 0  Number falls in past yr: 0 0 0 0 0  Injury with Fall? 0 0 0 0 0  Risk for fall due to : No Fall Risks  No Fall Risks  No Fall Risks  Follow up Falls prevention discussed  Falls prevention discussed;Education provided;Falls evaluation completed  Falls prevention discussed;Education provided;Falls evaluation completed     Functional Status Survey: Is the patient deaf or have difficulty hearing?: No Does the patient have difficulty seeing, even when wearing glasses/contacts?: No Does the patient have difficulty concentrating, remembering, or making decisions?: No Does the patient have difficulty walking or climbing stairs?: Yes Does the patient have difficulty dressing or bathing?: No Does the patient have difficulty doing errands alone such as visiting a doctor's office or shopping?: No   Assessment & Plan  1. Well adult exam  - DG Bone Density; Future - MM 3D SCREENING MAMMOGRAM BILATERAL BREAST; Future - Lipid panel - CBC with Differential/Platelet - COMPLETE METABOLIC PANEL WITH GFR - Hemoglobin A1c - B12 and Folate  Panel - VITAMIN D 25 Hydroxy (Vit-D Deficiency, Fractures) - TSH  2. Other neutropenia (HCC)  - CBC with Differential/Platelet  3. Vitamin D deficiency  - VITAMIN D 25 Hydroxy (Vit-D Deficiency, Fractures)  4. Pre-diabetes  - Hemoglobin A1c  5. Breast cancer screening by mammogram  - MM 3D SCREENING MAMMOGRAM BILATERAL BREAST; Future  6. Lipid screening  - Lipid panel  7. B12 deficiency  - B12 and Folate Panel  8. Osteopenia of left hip  - DG Bone Density; Future  9. Family history of thyroid disease  - TSH  10. Need for pneumococcal 20-valent conjugate vaccination  - Pneumococcal conjugate vaccine 20-valent (Prevnar 20)    -USPSTF grade A and B recommendations reviewed with patient; age-appropriate recommendations, preventive care, screening tests, etc discussed and encouraged; healthy living encouraged; see AVS for patient education given to patient -Discussed importance of 150 minutes of physical activity weekly, eat two servings of fish weekly, eat one serving of tree nuts ( cashews, pistachios, pecans, almonds.[name]) every other day, eat 6 servings  of fruit/vegetables daily and drink plenty of water and avoid sweet beverages.   -Reviewed Health Maintenance: Yes.

## 2023-05-22 ENCOUNTER — Ambulatory Visit (INDEPENDENT_AMBULATORY_CARE_PROVIDER_SITE_OTHER): Payer: PRIVATE HEALTH INSURANCE | Admitting: Family Medicine

## 2023-05-22 ENCOUNTER — Encounter: Payer: Self-pay | Admitting: Family Medicine

## 2023-05-22 VITALS — BP 120/70 | HR 76 | Resp 16 | Ht 63.0 in | Wt 190.0 lb

## 2023-05-22 DIAGNOSIS — Z0000 Encounter for general adult medical examination without abnormal findings: Secondary | ICD-10-CM

## 2023-05-22 DIAGNOSIS — R7303 Prediabetes: Secondary | ICD-10-CM | POA: Diagnosis not present

## 2023-05-22 DIAGNOSIS — Z13220 Encounter for screening for lipoid disorders: Secondary | ICD-10-CM

## 2023-05-22 DIAGNOSIS — D708 Other neutropenia: Secondary | ICD-10-CM | POA: Diagnosis not present

## 2023-05-22 DIAGNOSIS — Z23 Encounter for immunization: Secondary | ICD-10-CM | POA: Diagnosis not present

## 2023-05-22 DIAGNOSIS — Z0001 Encounter for general adult medical examination with abnormal findings: Secondary | ICD-10-CM

## 2023-05-22 DIAGNOSIS — M85852 Other specified disorders of bone density and structure, left thigh: Secondary | ICD-10-CM

## 2023-05-22 DIAGNOSIS — E538 Deficiency of other specified B group vitamins: Secondary | ICD-10-CM

## 2023-05-22 DIAGNOSIS — E559 Vitamin D deficiency, unspecified: Secondary | ICD-10-CM

## 2023-05-22 DIAGNOSIS — Z1231 Encounter for screening mammogram for malignant neoplasm of breast: Secondary | ICD-10-CM

## 2023-05-22 DIAGNOSIS — Z8349 Family history of other endocrine, nutritional and metabolic diseases: Secondary | ICD-10-CM

## 2023-05-22 NOTE — Patient Instructions (Signed)
Bone Density: [phone]

## 2023-05-23 LAB — COMPLETE METABOLIC PANEL WITH GFR
AG Ratio: 1.6 (calc) (ref 1.0–2.5)
ALT: 11 U/L (ref 6–29)
AST: 14 U/L (ref 10–35)
Albumin: 4.1 g/dL (ref 3.6–5.1)
Alkaline phosphatase (APISO): 41 U/L (ref 37–153)
BUN: 16 mg/dL (ref 7–25)
CO2: 28 mmol/L (ref 20–32)
Calcium: 9 mg/dL (ref 8.6–10.4)
Chloride: 104 mmol/L (ref 98–110)
Creat: 0.61 mg/dL (ref 0.50–1.05)
Globulin: 2.5 g/dL (ref 1.9–3.7)
Glucose, Bld: 82 mg/dL (ref 65–99)
Potassium: 3.8 mmol/L (ref 3.5–5.3)
Sodium: 141 mmol/L (ref 135–146)
Total Bilirubin: 1.2 mg/dL (ref 0.2–1.2)
Total Protein: 6.6 g/dL (ref 6.1–8.1)
eGFR: 99 mL/min/{1.73_m2} (ref >=60)

## 2023-05-23 LAB — B12 AND FOLATE PANEL
Folate: 13.5 ng/mL
Vitamin B-12: 460 pg/mL (ref 200–1100)

## 2023-05-23 LAB — LIPID PANEL
Cholesterol: 179 mg/dL (ref <200)
HDL: 58 mg/dL (ref >=50)
LDL Cholesterol (Calc): 104 mg/dL — ABNORMAL HIGH
Non-HDL Cholesterol (Calc): 121 mg/dL (ref <130)
Total CHOL/HDL Ratio: 3.1 (calc) (ref <5.0)
Triglycerides: 78 mg/dL (ref <150)

## 2023-05-23 LAB — CBC WITH DIFFERENTIAL/PLATELET
Absolute Monocytes: 476 {cells}/uL (ref 200–950)
Basophils Absolute: 31 {cells}/uL (ref 0–200)
Basophils Relative: 0.9 %
Eosinophils Absolute: 167 {cells}/uL (ref 15–500)
Eosinophils Relative: 4.9 %
HCT: 39.7 % (ref 35.0–45.0)
Hemoglobin: 12.6 g/dL (ref 11.7–15.5)
Lymphs Abs: 1224 {cells}/uL (ref 850–3900)
MCH: 29.5 pg (ref 27.0–33.0)
MCHC: 31.7 g/dL — ABNORMAL LOW (ref 32.0–36.0)
MCV: 93 fL (ref 80.0–100.0)
MPV: 9.2 fL (ref 7.5–12.5)
Monocytes Relative: 14 %
Neutro Abs: 1503 {cells}/uL (ref 1500–7800)
Neutrophils Relative %: 44.2 %
Platelets: 233 10*3/uL (ref 140–400)
RBC: 4.27 10*6/uL (ref 3.80–5.10)
RDW: 12.1 % (ref 11.0–15.0)
Total Lymphocyte: 36 %
WBC: 3.4 10*3/uL — ABNORMAL LOW (ref 3.8–10.8)

## 2023-05-23 LAB — VITAMIN D 25 HYDROXY (VIT D DEFICIENCY, FRACTURES): Vit D, 25-Hydroxy: 31 ng/mL (ref 30–100)

## 2023-05-23 LAB — HEMOGLOBIN A1C
Hgb A1c MFr Bld: 5.9 %{Hb} — ABNORMAL HIGH (ref <5.7)
Mean Plasma Glucose: 123 mg/dL
eAG (mmol/L): 6.8 mmol/L

## 2023-05-23 LAB — TSH: TSH: 1.98 m[IU]/L (ref 0.40–4.50)

## 2023-05-31 ENCOUNTER — Telehealth: Payer: Self-pay

## 2023-05-31 NOTE — Telephone Encounter (Signed)
Pt given lab results per notes of Dr. Carlynn Purl on 05/23/23. Pt verbalized understanding. Patient was given an example on sweets to avoid. Patient stated she eats cups of apple sauce and I advised to get the apple sauce that states no sugar added on the box. Patient verbalized understanding. Lipid panel is slightly better White count is low but stable No anemia A1C is up - in the pre diabetes range, try to cut down on sweets , pasta, bread and rice B12 is towards low end of normal - take sublingual B12 500 mcg a couple of days a week Other labs normal  Written by Alba Cory, MD on 05/23/2023  2:08 PM EDT

## 2023-07-11 ENCOUNTER — Ambulatory Visit
Admission: RE | Admit: 2023-07-11 | Discharge: 2023-07-11 | Disposition: A | Discharge status: Home / Self Care | Payer: PRIVATE HEALTH INSURANCE | Source: Ambulatory Visit | Attending: Family Medicine | Admitting: Family Medicine

## 2023-07-11 DIAGNOSIS — Z1231 Encounter for screening mammogram for malignant neoplasm of breast: Secondary | ICD-10-CM | POA: Insufficient documentation

## 2023-07-11 DIAGNOSIS — Z0000 Encounter for general adult medical examination without abnormal findings: Secondary | ICD-10-CM

## 2023-07-17 ENCOUNTER — Telehealth: Payer: Self-pay | Admitting: Family Medicine

## 2023-07-17 NOTE — Telephone Encounter (Signed)
Copied from CRM [id number]. Topic: General - Inquiry >> Jul 17, 2023  9:08 AM Lennox Pippins wrote: Patient called and states she received her flu shot on October 18th 2024 at her place of employment. She wanted to let PCP know.  Patients callback #[phone]

## 2023-07-17 NOTE — Telephone Encounter (Signed)
Documented

## 2023-08-14 ENCOUNTER — Ambulatory Visit
Admission: RE | Admit: 2023-08-14 | Discharge: 2023-08-14 | Disposition: A | Discharge status: Home / Self Care | Payer: PRIVATE HEALTH INSURANCE | Source: Ambulatory Visit | Attending: Family Medicine | Admitting: Family Medicine

## 2023-08-14 DIAGNOSIS — Z0000 Encounter for general adult medical examination without abnormal findings: Secondary | ICD-10-CM | POA: Insufficient documentation

## 2023-08-14 DIAGNOSIS — M85852 Other specified disorders of bone density and structure, left thigh: Secondary | ICD-10-CM | POA: Diagnosis present

## 2023-09-09 ENCOUNTER — Ambulatory Visit: Payer: BC Managed Care – PPO | Admitting: Family Medicine

## 2023-11-19 ENCOUNTER — Ambulatory Visit: Payer: PRIVATE HEALTH INSURANCE | Admitting: Family Medicine

## 2023-11-20 ENCOUNTER — Encounter: Payer: Self-pay | Admitting: Family Medicine

## 2023-11-20 ENCOUNTER — Ambulatory Visit: Payer: PRIVATE HEALTH INSURANCE | Admitting: Family Medicine

## 2023-11-20 ENCOUNTER — Telehealth: Payer: Self-pay

## 2023-11-20 VITALS — BP 118/74 | HR 75 | Temp 97.6°F | Resp 16 | Ht 63.0 in | Wt 192.7 lb

## 2023-11-20 DIAGNOSIS — M170 Bilateral primary osteoarthritis of knee: Secondary | ICD-10-CM

## 2023-11-20 DIAGNOSIS — D708 Other neutropenia: Secondary | ICD-10-CM

## 2023-11-20 DIAGNOSIS — E7800 Pure hypercholesterolemia, unspecified: Secondary | ICD-10-CM

## 2023-11-20 DIAGNOSIS — M8580 Other specified disorders of bone density and structure, unspecified site: Secondary | ICD-10-CM

## 2023-11-20 DIAGNOSIS — E559 Vitamin D deficiency, unspecified: Secondary | ICD-10-CM

## 2023-11-20 DIAGNOSIS — R7303 Prediabetes: Secondary | ICD-10-CM | POA: Diagnosis not present

## 2023-11-20 DIAGNOSIS — E538 Deficiency of other specified B group vitamins: Secondary | ICD-10-CM | POA: Insufficient documentation

## 2023-11-20 DIAGNOSIS — Z780 Asymptomatic menopausal state: Secondary | ICD-10-CM

## 2023-11-20 MED ORDER — DICLOFENAC SODIUM 1 % EX GEL: 4.0000 g | Freq: Four times a day (QID) | CUTANEOUS | 1 refills | Status: DC

## 2023-11-20 NOTE — Telephone Encounter (Signed)
 Pt.notified

## 2023-11-20 NOTE — Telephone Encounter (Signed)
 Does patient need bloodwork today?

## 2023-11-20 NOTE — Telephone Encounter (Signed)
 Copied from CRM [id number]. Topic: General - Other >> Nov 20, 2023  8:07 AM Gaetano Hawthorne wrote: Reason for CRM: Patient would like to know if she will have lab work ordered today - her appointment is at 1 pm with Dr. Carlynn Purl today.

## 2023-11-20 NOTE — Progress Notes (Signed)
 Name: Kathy Benjamin   MRN: [id number]    DOB: [birth year]   Date:11/20/2023       Progress Note  Subjective  Chief Complaint  Chief Complaint  Patient presents with   Medical Management of Chronic Issues   HPI   Other Neutropenia:  stable, last level was 3.4, no anemia and normal platelets . Likely familial    Pre-diabetes: her A1C was 5.9 % Sep 2024   She denies polyphagia, polydipsia or polyuria. She likes sweets but has been cutting back. Reminded her of a diabetic diet. Discussed Metformin but she wants to hold off for now    OA both knees: seeing [facility] and was given celebrex but stopped due to possible side effects, taking Tyenol  She has effusion intermittently. Pain is dull and mild and stable . She walks at work and stands all day. She takes Tylenol prn also using some topical medication, we will send rx to use good RX    Vitamin D deficiency: continue vitamin D  2000 units daily and level has been good, continue supplementation   Osteopenia post-menopausal: we will recheck every two years, low Frax score. Continue vitamin D and high calcium diet    History of cystocele: she had a hysterectomy, she also has a history of pelvic floor dysfunction but doing well, only has incontinence when she holds for a while and has to rush to void ut she does not want to take medication    Family history of thyroid disease: two sisters and brother, last TSH normal and no symptoms of thyroid disease at this time  Dyslipidemia: continue life style modifications, last LDL was 104   The 10-year ASCVD risk score (Arnett DK, et al., 2019) is: 6.4%   Values used to calculate the score:     Age: 17 years     Sex: Female     Is Non-Hispanic African American: Yes     Diabetic: No     Tobacco smoker: No     Systolic Blood Pressure: 118 mmHg     Is BP treated: No     HDL Cholesterol: 58 mg/dL     Total Cholesterol: 179 mg/dL   Patient Active Problem List   Diagnosis Date Noted   Other  neutropenia (HCC) 11/14/2021   Osteopenia 07/02/2018   Bradycardia 05/07/2018   Leucocytosis 05/10/2017   Urge incontinence of urine 05/06/2017   Osteoarthritis of right hip 05/06/2017   Post herpetic neuralgia 08/15/2015   Bunion of left foot 05/02/2015   H/O: hysterectomy 05/02/2015   Edema leg 04/29/2015   Obesity (BMI 30.0-34.9) 04/29/2015   Primary localized osteoarthrosis, lower leg 04/29/2015   Perennial allergic rhinitis with seasonal variation 04/29/2015   Borderline diabetes 04/29/2015   Menopausal symptom 04/29/2015   Vitamin D deficiency 04/29/2015   Abnormal electrocardiogram 04/02/2007    Past Surgical History:  Procedure Laterality Date   ABDOMINAL HYSTERECTOMY     BREAST EXCISIONAL BIOPSY Right 2009   neg   BREAST SURGERY Right [id number]   biopsy   COLONOSCOPY     COLONOSCOPY WITH PROPOFOL N/A 08/05/2015   Procedure: COLONOSCOPY WITH PROPOFOL;  Surgeon: Midge Minium, MD;  Location: [facility];  Service: Endoscopy;  Laterality: N/A;   HERNIA REPAIR     umbilical    Family History  Problem Relation Age of Onset   Diabetes Mother    Hypertension Mother    Stroke Father    Hypertension Father    Cancer Father  Hyperthyroidism Sister    Cancer Brother        prostate   Hyperthyroidism Brother    Kidney disease Sister    Breast cancer Neg Hx     Social History   Tobacco Use   Smoking status: Never   Smokeless tobacco: Never  Substance Use Topics   Alcohol use: No    Alcohol/week: 0.0 standard drinks of alcohol     Current Outpatient Medications:    acetaminophen (TYLENOL) 500 MG tablet, Take 1 tablet (500 mg total) by mouth every 6 (six) hours as needed., Disp: 90 tablet, Rfl: 1   Cholecalciferol (VITAMIN D) 2000 units CAPS, Take 1 capsule by mouth daily., Disp: , Rfl:    fluticasone (FLONASE) 50 MCG/ACT nasal spray, SPRAY 2 SPRAYS INTO EACH NOSTRIL EVERY DAY, Disp: 16 mL, Rfl: 4   loratadine (CLARITIN) 10 MG tablet, TAKE 1 TABLET BY  MOUTH TWICE A DAY, Disp: 180 tablet, Rfl: 0   meclizine (ANTIVERT) 25 MG tablet, Take 0.5-1 tablets (12.5-25 mg total) by mouth 3 (three) times daily as needed for dizziness or nausea., Disp: 30 tablet, Rfl: 1   meloxicam (MOBIC) 15 MG tablet, Take 15 mg by mouth daily., Disp: , Rfl:    Misc Natural Products (GLUCOSAMINE CHOND CMP DOUBLE PO), Take by mouth., Disp: , Rfl:    triamcinolone cream (KENALOG) 0.1 %, APPLY TO AFFECTED AREA TWICE A DAY, Disp: 45 g, Rfl: 0   Olopatadine HCl (PATADAY) 0.2 % SOLN, Apply to eye. (Patient not taking: Reported on 11/20/2023), Disp: , Rfl:   No Known Allergies  I personally reviewed active problem list, medication list, allergies, family history with the patient/caregiver today.   ROS  Ten systems reviewed and is negative except as mentioned in HPI    Objective  Vitals:   11/20/23 1302  BP: 118/74  Pulse: 75  Resp: 16  Temp: 97.6 F (36.4 C)  TempSrc: Oral  SpO2: 98%  Weight: 192 lb 11.2 oz (87.4 kg)  Height: 5\' 3"  (1.6 m)    Body mass index is 34.14 kg/m.  Physical Exam  Constitutional: Patient appears well-developed and well-nourished. Obese  No distress.  HEENT: head atraumatic, normocephalic, pupils equal and reactive to light, neck supple Cardiovascular: Normal rate, regular rhythm and normal heart sounds.  No murmur heard. No BLE edema. Pulmonary/Chest: Effort normal and breath sounds normal. No respiratory distress. Abdominal: Soft.  There is no tenderness. Psychiatric: Patient has a normal mood and affect. behavior is normal. Judgment and thought content normal.    Diabetic Foot Exam:     PHQ2/9:    11/20/2023    1:01 PM 05/22/2023    7:44 AM 04/08/2023    1:34 PM 12/31/2022   11:32 AM 10/19/2022    1:13 PM  Depression screen PHQ 2/9  Decreased Interest 0 0 0 0 0  Down, Depressed, Hopeless 0 0 0 0 0  PHQ - 2 Score 0 0 0 0 0  Altered sleeping 0 0  0 0  Tired, decreased energy 0 0  0 0  Change in appetite 0 0  0 0  Feeling  bad or failure about yourself  0 0  0 0  Trouble concentrating 0 0  0 0  Moving slowly or fidgety/restless 0 0  0 0  Suicidal thoughts 0 0  0 0  PHQ-9 Score 0 0  0 0  Difficult doing work/chores Not difficult at all   Not difficult at all Not difficult at all  phq 9 is negative  Fall Risk:    11/20/2023    1:01 PM 05/22/2023    7:44 AM 04/08/2023    1:34 PM 12/31/2022   11:31 AM 10/19/2022    1:13 PM  Fall Risk   Falls in the past year? 0 0 0 0 0  Number falls in past yr: 0 0 0 0 0  Injury with Fall? 0 0 0 0 0  Risk for fall due to : No Fall Risks No Fall Risks  No Fall Risks   Follow up Falls prevention discussed;Education provided;Falls evaluation completed Falls prevention discussed  Falls prevention discussed;Education provided;Falls evaluation completed      Assessment & Plan  1. Other neutropenia (HCC) (Primary)  Stable, recheck it yearly  2. Osteopenia after menopause  stable  3. Pre-diabetes  On diet   4. B12 deficiency  Resume supplementation   5. Vitamin D deficiency  Continue supplementation   6. Primary osteoarthritis of both knees  - diclofenac Sodium (VOLTAREN) 1 % GEL; Apply 4 g topically 4 (four) times daily.  Dispense: 300 g; Refill: 1  7. Hypercholesterolemia  On diet only

## 2023-12-20 ENCOUNTER — Ambulatory Visit: Payer: Self-pay

## 2023-12-20 NOTE — Telephone Encounter (Signed)
  Chief Complaint: rash Symptoms: rash  Disposition: [] ED /[] Urgent Care (no appt availability in office) / [] Appointment(In office/virtual)/ []  [place] [facility]/ [x] Home Care/ [] Refused Recommended Disposition /[] [place] [facility]/ [x]  Follow-up with PCP Additional Notes: Pt called with rash on right knee after using Voltaran gel. Rash has been present for nearly 2 weeks. Pt was using it 4x daily and wants to know if she should cut amount or stop all together. Pt states rash is red tiny bumps. Denies itching or spreading.  RN gave home care advice an to wait for follow up call with PCP on changing dosage/medication. Pt verbalized understanding. Please advise.             Reason for Disposition  Mild localized rash  Answer Assessment - Initial Assessment Questions 1. APPEARANCE of RASH: "Describe the rash."      Little red bumps 2. LOCATION: "Where is the rash located?"      Right knee 3. NUMBER: "How many spots are there?"      10 4. SIZE: "How big are the spots?" (Inches, centimeters or compare to size of a coin)      Tiny  5. ONSET: "When did the rash start?"      2 weeks ago  6. ITCHING: "Does the rash itch?" If Yes, ask: "How bad is the itch?"  (Scale 0-10; or none, mild, moderate, severe)     Only one day  7. PAIN: "Does the rash hurt?" If Yes, ask: "How bad is the pain?"  (Scale 0-10; or none, mild, moderate, severe)    - NONE (0): no pain    - MILD (1-3): doesn't interfere with normal activities     - MODERATE (4-7): interferes with normal activities or awakens from sleep     - SEVERE (8-10): excruciating pain, unable to do any normal activities     Denies  8. OTHER SYMPTOMS: "Do you have any other symptoms?" (e.g., fever)     Denies  Protocols used: Rash or Redness - Localized-A-AH

## 2023-12-20 NOTE — Telephone Encounter (Signed)
 Spoke to patient and advised to STOP it, pt then states she went and got one OTC and advised to stop using both of them to see if the rash resolves. Pt verbalized understanding

## 2023-12-20 NOTE — Telephone Encounter (Signed)
 Copied from CRM [id number]. Topic: Clinical - Medication Question >> Dec 20, 2023  9:08 AM Elle L wrote: Reason for CRM: The patient is getting a rash and bumps from her diclofenac Sodium (VOLTAREN) 1 % GEL prescription. She advised that it is not painful and she not having any further symptoms. However, she is unsure if she should use it less. The patient's call back number is [phone].  Voice mailbox is full, unable to leave a message.

## 2024-05-21 NOTE — Patient Instructions (Signed)
 Preventive Care 83 Years and Older, Female Preventive care refers to lifestyle choices and visits with your health care provider that can promote health and wellness. Preventive care visits are also called wellness exams. What can I expect for my preventive care visit? Counseling Your health care provider may ask you questions about your: Medical history, including: Past medical problems. Family medical history. Pregnancy and menstrual history. History of falls. Current health, including: Memory and ability to understand (cognition). Emotional well-being. Home life and relationship well-being. Sexual activity and sexual health. Lifestyle, including: Alcohol, nicotine or tobacco, and drug use. Access to firearms. Diet, exercise, and sleep habits. Work and work Astronomer. Sunscreen use. Safety issues such as seatbelt and bike helmet use. Physical exam Your health care provider will check your: Height and weight. These may be used to calculate your BMI (body mass index). BMI is a measurement that tells if you are at a healthy weight. Waist circumference. This measures the distance around your waistline. This measurement also tells if you are at a healthy weight and may help predict your risk of certain diseases, such as type 2 diabetes and high blood pressure. Heart rate and blood pressure. Body temperature. Skin for abnormal spots. What immunizations do I need?  Vaccines are usually given at various ages, according to a schedule. Your health care provider will recommend vaccines for you based on your age, medical history, and lifestyle or other factors, such as travel or where you work. What tests do I need? Screening Your health care provider may recommend screening tests for certain conditions. This may include: Lipid and cholesterol levels. Hepatitis C test. Hepatitis B test. HIV (human immunodeficiency virus) test. STI (sexually transmitted infection) testing, if you are at  risk. Lung cancer screening. Colorectal cancer screening. Diabetes screening. This is done by checking your blood sugar (glucose) after you have not eaten for a while (fasting). Mammogram. Talk with your health care provider about how often you should have regular mammograms. BRCA-related cancer screening. This may be done if you have a family history of breast, ovarian, tubal, or peritoneal cancers. Bone density scan. This is done to screen for osteoporosis. Talk with your health care provider about your test results, treatment options, and if necessary, the need for more tests. Follow these instructions at home: Eating and drinking  Eat a diet that includes fresh fruits and vegetables, whole grains, lean protein, and low-fat dairy products. Limit your intake of foods with high amounts of sugar, saturated fats, and salt. Take vitamin and mineral supplements as recommended by your health care provider. Do not drink alcohol if your health care provider tells you not to drink. If you drink alcohol: Limit how much you have to 0-1 drink a day. Know how much alcohol is in your drink. In the [place] one drink equals one 12 oz bottle of beer (355 mL), one 5 oz glass of wine (148 mL), or one 1 oz glass of hard liquor (44 mL). Lifestyle Brush your teeth every morning and night with fluoride toothpaste. Floss one time each day. Exercise for at least 30 minutes 5 or more days each week. Do not use any products that contain nicotine or tobacco. These products include cigarettes, chewing tobacco, and vaping devices, such as e-cigarettes. If you need help quitting, ask your health care provider. Do not use drugs. If you are sexually active, practice safe sex. Use a condom or other form of protection in order to prevent STIs. Take aspirin only as told by  your health care provider. Make sure that you understand how much to take and what form to take. Work with your health care provider to find out whether it  is safe and beneficial for you to take aspirin daily. Ask your health care provider if you need to take a cholesterol-lowering medicine (statin). Find healthy ways to manage stress, such as: Meditation, yoga, or listening to music. Journaling. Talking to a trusted person. Spending time with friends and family. Minimize exposure to UV radiation to reduce your risk of skin cancer. Safety Always wear your seat belt while driving or riding in a vehicle. Do not drive: If you have been drinking alcohol. Do not ride with someone who has been drinking. When you are tired or distracted. While texting. If you have been using any mind-altering substances or drugs. Wear a helmet and other protective equipment during sports activities. If you have firearms in your house, make sure you follow all gun safety procedures. What's next? Visit your health care provider once a year for an annual wellness visit. Ask your health care provider how often you should have your eyes and teeth checked. Stay up to date on all vaccines. This information is not intended to replace advice given to you by your health care provider. Make sure you discuss any questions you have with your health care provider. Document Revised: 03/01/2021 Document Reviewed: 03/01/2021 Elsevier Patient Education  2024 [employer].

## 2024-05-25 ENCOUNTER — Ambulatory Visit (INDEPENDENT_AMBULATORY_CARE_PROVIDER_SITE_OTHER): Payer: PRIVATE HEALTH INSURANCE | Admitting: Family Medicine

## 2024-05-25 ENCOUNTER — Encounter: Payer: Self-pay | Admitting: Family Medicine

## 2024-05-25 VITALS — BP 116/74 | HR 66 | Resp 16 | Ht 62.5 in | Wt 182.8 lb

## 2024-05-25 DIAGNOSIS — R7303 Prediabetes: Secondary | ICD-10-CM

## 2024-05-25 DIAGNOSIS — Z8349 Family history of other endocrine, nutritional and metabolic diseases: Secondary | ICD-10-CM

## 2024-05-25 DIAGNOSIS — Z113 Encounter for screening for infections with a predominantly sexual mode of transmission: Secondary | ICD-10-CM

## 2024-05-25 DIAGNOSIS — E559 Vitamin D deficiency, unspecified: Secondary | ICD-10-CM

## 2024-05-25 DIAGNOSIS — E538 Deficiency of other specified B group vitamins: Secondary | ICD-10-CM

## 2024-05-25 DIAGNOSIS — E7800 Pure hypercholesterolemia, unspecified: Secondary | ICD-10-CM | POA: Diagnosis not present

## 2024-05-25 DIAGNOSIS — Z1231 Encounter for screening mammogram for malignant neoplasm of breast: Secondary | ICD-10-CM

## 2024-05-25 DIAGNOSIS — Z0000 Encounter for general adult medical examination without abnormal findings: Secondary | ICD-10-CM

## 2024-05-25 NOTE — Progress Notes (Signed)
 Name: Kathy Benjamin   MRN: [id number]    DOB: 08-05-1956   Date:05/25/2024       Progress Note  Subjective  Chief Complaint  Chief Complaint  Patient presents with   Annual Exam    HPI  Patient presents for annual CPE.  Diet: cooks at home, not eating after 6 pm, cutting down on calories and drinking more water Exercise:  needs to increase physical activity  Last Eye Exam: completed Last Dental Exam: completed  Flowsheet Row Office Visit from 05/25/2024 in [facility]  AUDIT-C Score 0   Depression: Phq 9 is  negative    05/25/2024    7:52 AM 11/20/2023    1:01 PM 05/22/2023    7:44 AM 04/08/2023    1:34 PM 12/31/2022   11:32 AM  Depression screen PHQ 2/9  Decreased Interest 0 0 0 0 0  Down, Depressed, Hopeless 0 0 0 0 0  PHQ - 2 Score 0 0 0 0 0  Altered sleeping  0 0  0  Tired, decreased energy  0 0  0  Change in appetite  0 0  0  Feeling bad or failure about yourself   0 0  0  Trouble concentrating  0 0  0  Moving slowly or fidgety/restless  0 0  0  Suicidal thoughts  0 0  0  PHQ-9 Score  0 0  0  Difficult doing work/chores  Not difficult at all   Not difficult at all   Hypertension: BP Readings from Last 3 Encounters:  05/25/24 116/74  11/20/23 118/74  05/22/23 120/70   Obesity: Wt Readings from Last 3 Encounters:  05/25/24 182 lb 12.8 oz (82.9 kg)  11/20/23 192 lb 11.2 oz (87.4 kg)  05/22/23 190 lb (86.2 kg)   BMI Readings from Last 3 Encounters:  05/25/24 32.90 kg/m  11/20/23 34.14 kg/m  05/22/23 33.66 kg/m     Vaccines: reviewed with the patient.   Hep C Screening: completed STD testing and prevention (HIV/chl/gon/syphilis): she wants to be checked for HIV  Intimate partner violence: negative screen  Sexual History : not sexually active  Menstrual History/LMP/Abnormal Bleeding: s/p hysterectomy  Discussed importance of follow up if any post-menopausal bleeding: not applicable  Incontinence Symptoms: positive for  symptoms - for urge incontinence   Breast cancer:  - Last Mammogram: she will schedule  - BRCA gene screening: N/A  Osteoporosis Prevention : Discussed high calcium and vitamin D  supplementation, weight bearing exercises Bone density :yes   Cervical cancer screening: not applicable due to hysterectomy  Skin cancer: Discussed monitoring for atypical lesions  Colorectal cancer: repeat in Nov 2026   Lung cancer:  Low Dose CT Chest recommended if Age 60-80 years, 20 pack-year currently smoking OR have quit w/in 15years. Patient does not qualify for screen   ECG: 2019  Advanced Care Planning: A voluntary discussion about advance care planning including the explanation and discussion of advance directives.  Discussed health care proxy and Living will, and the patient was able to identify a health care proxy as sister Kathy Benjamin.  Patient does not have a living will and power of attorney of health care   Patient Active Problem List   Diagnosis Date Noted   Primary osteoarthritis of both knees 11/20/2023   B12 deficiency 11/20/2023   Other neutropenia (HCC) 11/14/2021   Osteopenia after menopause 07/02/2018   Urge incontinence of urine 05/06/2017   Post herpetic neuralgia 08/15/2015   Bunion  of left foot 05/02/2015   H/O: hysterectomy 05/02/2015   Edema leg 04/29/2015   Obesity (BMI 30.0-34.9) 04/29/2015   Primary localized osteoarthrosis, lower leg 04/29/2015   Perennial allergic rhinitis with seasonal variation 04/29/2015   Pre-diabetes 04/29/2015   Menopausal symptom 04/29/2015   Vitamin D  deficiency 04/29/2015   Abnormal electrocardiogram 04/02/2007    Past Surgical History:  Procedure Laterality Date   ABDOMINAL HYSTERECTOMY     BREAST EXCISIONAL BIOPSY Right 2009   neg   BREAST SURGERY Right [id number]   biopsy   COLONOSCOPY     COLONOSCOPY WITH PROPOFOL  N/A 08/05/2015   Procedure: COLONOSCOPY WITH PROPOFOL ;  Surgeon: Rogelia Copping, MD;  Location: [facility];  Service:  Endoscopy;  Laterality: N/A;   HERNIA REPAIR     umbilical    Family History  Problem Relation Age of Onset   Diabetes Mother    Hypertension Mother    Stroke Father    Hypertension Father    Cancer Father    Hyperthyroidism Sister    Cancer Brother        prostate   Hyperthyroidism Brother    Kidney disease Sister    Breast cancer Neg Hx     Social History   Socioeconomic History   Marital status: Single    Spouse name: Not on file   Number of children: 0   Years of education: Not on file   Highest education level: High school graduate  Occupational History   Not on file  Tobacco Use   Smoking status: Never   Smokeless tobacco: Never  Vaping Use   Vaping status: Never Used  Substance and Sexual Activity   Alcohol use: No    Alcohol/week: 0.0 standard drinks of alcohol   Drug use: No   Sexual activity: Not Currently  Other Topics Concern   Not on file  Social History Narrative   Not working since March 31 st 2020 because of COVID-19 may be re-hired after the Summer    Social Drivers of [employer]: Low Risk  (05/25/2024)   Overall Financial Resource Strain (CARDIA)    Difficulty of Paying Living Expenses: Not hard at all  Food Insecurity: No Food Insecurity (05/25/2024)   Hunger Vital Sign    Worried About Running Out of Food in the Last Year: Never true    Ran Out of Food in the Last Year: Never true  Transportation Needs: No Transportation Needs (05/25/2024)   PRAPARE - Administrator, Civil Service (Medical): No    Lack of Transportation (Non-Medical): No  Physical Activity: Unknown (05/25/2024)   Exercise Vital Sign    Days of Exercise per Week: Not on file    Minutes of Exercise per Session: 20 min  Stress: No Stress Concern Present (05/25/2024)   [employer] of Occupational Health - Occupational Stress Questionnaire    Feeling of Stress: Not at all  Social Connections: Moderately Integrated (05/25/2024)   Social  Connection and Isolation Panel    Frequency of Communication with Friends and Family: More than three times a week    Frequency of Social Gatherings with Friends and Family: More than three times a week    Attends Religious Services: 1 to 4 times per year    Active Member of [employer] or Organizations: Yes    Attends Engineer, structural: More than 4 times per year    Marital Status: Never married  Intimate Partner Violence: Not At Risk (  05/25/2024)   Humiliation, Afraid, Rape, and Kick questionnaire    Fear of Current or Ex-Partner: No    Emotionally Abused: No    Physically Abused: No    Sexually Abused: No     Current Outpatient Medications:    acetaminophen  (TYLENOL ) 500 MG tablet, Take 1 tablet (500 mg total) by mouth every 6 (six) hours as needed., Disp: 90 tablet, Rfl: 1   Cholecalciferol (VITAMIN D ) 2000 units CAPS, Take 1 capsule by mouth daily., Disp: , Rfl:    diclofenac  Sodium (VOLTAREN ) 1 % GEL, Apply 4 g topically 4 (four) times daily., Disp: 300 g, Rfl: 1   fluticasone  (FLONASE ) 50 MCG/ACT nasal spray, SPRAY 2 SPRAYS INTO EACH NOSTRIL EVERY DAY, Disp: 16 mL, Rfl: 4   loratadine  (CLARITIN ) 10 MG tablet, TAKE 1 TABLET BY MOUTH TWICE A DAY, Disp: 180 tablet, Rfl: 0   triamcinolone  cream (KENALOG ) 0.1 %, APPLY TO AFFECTED AREA TWICE A DAY, Disp: 45 g, Rfl: 0  No Known Allergies   ROS  Constitutional: Negative for fever, positive for  weight change. - she has changed her diet  Respiratory: Negative for cough and shortness of breath.   Cardiovascular: Negative for chest pain or palpitations.  Gastrointestinal: Negative for abdominal pain, no bowel changes.  Musculoskeletal: Negative for gait problem or joint swelling.  Skin: Negative for rash.  Neurological: Negative for dizziness or headache. She has noticed some tingling on left hand at the end of the day that improves when she shakes her hand also has some pain on right knee and left knee ( sees Dr. Leora) No other  specific complaints in a complete review of systems (except as listed in HPI above).   Objective  Vitals:   05/25/24 0758  BP: 116/74  Pulse: 66  Resp: 16  SpO2: 100%  Weight: 182 lb 12.8 oz (82.9 kg)  Height: 5' 2.5 (1.588 m)    Body mass index is 32.9 kg/m.  Physical Exam  Constitutional: Patient appears well-developed and well-nourished. No distress.  HENT: Head: Normocephalic and atraumatic. Ears: B TMs ok, no erythema or effusion; Nose: Nose normal. Mouth/Throat: Oropharynx is clear and moist. No oropharyngeal exudate.  Eyes: Conjunctivae and EOM are normal. Pupils are equal, round, and reactive to light. No scleral icterus.  Neck: Normal range of motion. Neck supple. No JVD present. No thyromegaly present.  Cardiovascular: Normal rate, regular rhythm and normal heart sounds.  No murmur heard. No BLE edema. Pulmonary/Chest: Effort normal and breath sounds normal. No respiratory distress. Abdominal: Soft. Bowel sounds are normal, no distension. There is no tenderness. no masses Breast: no lumps or masses, no nipple discharge or rashes FEMALE GENITALIA:  Not done  RECTAL: not done  Musculoskeletal: effusionon both knees, tinnel positive  Neurological: he is alert and oriented to person, place, and time. No cranial nerve deficit. Coordination, balance, strength, speech and gait are normal.  Skin: Skin is warm and dry. No rash noted. No erythema.  Psychiatric: Patient has a normal mood and affect. behavior is normal. Judgment and thought content normal.     Assessment & Plan  1. Well adult exam (Primary)  - Comprehensive metabolic panel with GFR - Hemoglobin A1c - CBC with Differential/Platelet - Lipid panel - VITAMIN D  25 Hydroxy (Vit-D Deficiency, Fractures) - TSH - B12 and Folate Panel - MM 3D SCREENING MAMMOGRAM BILATERAL BREAST; Future - HIV Antibody (routine testing w rflx)  2. B12 deficiency  - B12 and Folate Panel  3. Vitamin D   deficiency  - VITAMIN  D 25 Hydroxy (Vit-D Deficiency, Fractures)  4. Hypercholesterolemia  - Lipid panel  5. Pre-diabetes  - Hemoglobin A1c  6. Family history of thyroid  disease  - TSH  7. Encounter for screening mammogram for malignant neoplasm of breast  -  3D SCREENING MAMMOGRAM BILATERAL BREAST; Future  8. Screening examination for STI  - HIV Antibody (routine testing w rflx)   -USPSTF grade A and B recommendations reviewed with patient; age-appropriate recommendations, preventive care, screening tests, etc discussed and encouraged; healthy living encouraged; see AVS for patient education given to patient -Discussed importance of 150 minutes of physical activity weekly, eat two servings of fish weekly, eat one serving of tree nuts ( cashews, pistachios, pecans, almonds.SABRA) every other day, eat 6 servings of fruit/vegetables daily and drink plenty of water and avoid sweet beverages.   -Reviewed Health Maintenance: Yes.

## 2024-05-27 ENCOUNTER — Ambulatory Visit: Payer: Self-pay | Admitting: Family Medicine

## 2024-05-27 LAB — CBC WITH DIFFERENTIAL/PLATELET
Absolute Lymphocytes: 1218 {cells}/uL (ref 850–3900)
Absolute Monocytes: 377 {cells}/uL (ref 200–950)
Basophils Absolute: 20 {cells}/uL (ref 0–200)
Basophils Relative: 0.7 %
Eosinophils Absolute: 70 {cells}/uL (ref 15–500)
Eosinophils Relative: 2.4 %
HCT: 41.7 % (ref 35.0–45.0)
Hemoglobin: 13.3 g/dL (ref 11.7–15.5)
MCH: 30.1 pg (ref 27.0–33.0)
MCHC: 31.9 g/dL — ABNORMAL LOW (ref 32.0–36.0)
MCV: 94.3 fL (ref 80.0–100.0)
MPV: 9.6 fL (ref 7.5–12.5)
Monocytes Relative: 13 %
Neutro Abs: 1215 {cells}/uL — ABNORMAL LOW (ref 1500–7800)
Neutrophils Relative %: 41.9 %
Platelets: 200 Thousand/uL (ref 140–400)
RBC: 4.42 Million/uL (ref 3.80–5.10)
RDW: 12.7 % (ref 11.0–15.0)
Total Lymphocyte: 42 %
WBC: 2.9 Thousand/uL — ABNORMAL LOW (ref 3.8–10.8)

## 2024-05-27 LAB — LIPID PANEL
Cholesterol: 202 mg/dL — ABNORMAL HIGH (ref <200)
HDL: 68 mg/dL (ref >=50)
LDL Cholesterol (Calc): 118 mg/dL — ABNORMAL HIGH
Non-HDL Cholesterol (Calc): 134 mg/dL — ABNORMAL HIGH (ref <130)
Total CHOL/HDL Ratio: 3 (calc) (ref <5.0)
Triglycerides: 70 mg/dL (ref <150)

## 2024-05-27 LAB — HIV ANTIBODY (ROUTINE TESTING W REFLEX)
HIV 1&2 Ab, 4th Generation: NONREACTIVE
HIV FINAL INTERPRETATION: NEGATIVE

## 2024-05-27 LAB — VITAMIN D 25 HYDROXY (VIT D DEFICIENCY, FRACTURES): Vit D, 25-Hydroxy: 36 ng/mL (ref 30–100)

## 2024-05-27 LAB — COMPREHENSIVE METABOLIC PANEL WITH GFR
AG Ratio: 1.7 (calc) (ref 1.0–2.5)
ALT: 8 U/L (ref 6–29)
AST: 15 U/L (ref 10–35)
Albumin: 4.2 g/dL (ref 3.6–5.1)
Alkaline phosphatase (APISO): 37 U/L (ref 37–153)
BUN: 17 mg/dL (ref 7–25)
CO2: 28 mmol/L (ref 20–32)
Calcium: 9 mg/dL (ref 8.6–10.4)
Chloride: 105 mmol/L (ref 98–110)
Creat: 0.69 mg/dL (ref 0.50–1.05)
Globulin: 2.5 g/dL (ref 1.9–3.7)
Glucose, Bld: 82 mg/dL (ref 65–99)
Potassium: 4.2 mmol/L (ref 3.5–5.3)
Sodium: 141 mmol/L (ref 135–146)
Total Bilirubin: 1.3 mg/dL — ABNORMAL HIGH (ref 0.2–1.2)
Total Protein: 6.7 g/dL (ref 6.1–8.1)
eGFR: 96 mL/min/1.73m2 (ref >=60)

## 2024-05-27 LAB — B12 AND FOLATE PANEL
Folate: 15 ng/mL
Vitamin B-12: 707 pg/mL (ref 200–1100)

## 2024-05-27 LAB — HEMOGLOBIN A1C
Hgb A1c MFr Bld: 5.8 % — ABNORMAL HIGH (ref <5.7)
Mean Plasma Glucose: 120 mg/dL
eAG (mmol/L): 6.6 mmol/L

## 2024-05-27 LAB — TSH: TSH: 1.68 m[IU]/L (ref 0.40–4.50)

## 2024-06-29 ENCOUNTER — Telehealth: Payer: Self-pay

## 2024-06-29 NOTE — Telephone Encounter (Signed)
 Added to chart

## 2024-06-29 NOTE — Telephone Encounter (Signed)
 Copied from CRM [id number]. Topic: General - Other >> Jun 29, 2024 11:51 AM Avram MATSU wrote: Reason for CRM: pt is calling to let provider know she got her flu shot on 10/10

## 2024-07-17 ENCOUNTER — Ambulatory Visit: Payer: PRIVATE HEALTH INSURANCE

## 2024-07-17 VITALS — BP 116/72 | HR 59 | Resp 16 | Ht 63.0 in | Wt 182.0 lb

## 2024-07-17 DIAGNOSIS — J0110 Acute frontal sinusitis, unspecified: Secondary | ICD-10-CM

## 2024-07-17 MED ORDER — AMOXICILLIN-POT CLAVULANATE 875-125 MG PO TABS: 1.0000 | ORAL_TABLET | Freq: Two times a day (BID) | ORAL | 0 refills | Status: AC

## 2024-07-17 NOTE — Progress Notes (Signed)
 Acute visit   Patient: Kathy Benjamin   DOB: [birth year]   68 y.o. Female  MRN: [id number] PCP: Glenard Mire, MD   Chief Complaint  Patient presents with   Acute Visit    possible sinus infection( 2 wks)   Subjective    Discussed the use of AI scribe software for clinical note transcription with the patient, who gave verbal consent to proceed.  History of Present Illness Kathy Benjamin is a 68 year old female who presents with sinus issues and congestion.  She has been experiencing sinus issues for about one to two weeks, characterized by a feeling of heaviness in the sinus area and an inability to effectively clear nasal congestion. There is nasal discharge that does not resolve with blowing. No fevers, chills, sore throat, or difficulty breathing. She experiences hot and cold sensations, which she attributes to temperature changes at her workplace.  She has a history of sinus infections, with the last episode progressing to her chest, necessitating a visit to urgent care where she was prescribed an inhaler. No headaches associated with the sinus pain. Occasional ear popping is noted, but no significant ear pain.  For her sinus issues, she has been using a nasal spray daily, which provides minimal relief.    Review of systems as noted in HPI.   Objective    BP 116/72 (BP Location: Left Arm, Patient Position: Sitting)   Pulse (!) 59   Resp 16   Ht 5' 3 (1.6 m)   Wt 182 lb (82.6 kg)   SpO2 100%   BMI 32.24 kg/m  Physical Exam Constitutional:      Appearance: Normal appearance.  HENT:     Head: Normocephalic and atraumatic.     Right Ear: Tympanic membrane, ear canal and external ear normal.     Left Ear: Tympanic membrane, ear canal and external ear normal.     Nose: Congestion present.     Right Sinus: Frontal sinus tenderness present.     Left Sinus: Frontal sinus tenderness present.     Mouth/Throat:     Mouth: Mucous membranes are moist.      Pharynx: Posterior oropharyngeal erythema present.  Eyes:     Pupils: Pupils are equal, round, and reactive to light.  Cardiovascular:     Rate and Rhythm: Normal rate and regular rhythm.     Heart sounds: Normal heart sounds.  Pulmonary:     Effort: Pulmonary effort is normal.     Breath sounds: Normal breath sounds.  Skin:    General: Skin is warm.  Neurological:     General: No focal deficit present.     Mental Status: She is alert.       No results found for any visits on 07/17/24.  Assessment & Plan     Problem List Items Addressed This Visit   None Visit Diagnoses       Acute non-recurrent frontal sinusitis    -  Primary   Relevant Medications   amoxicillin -clavulanate (AUGMENTIN ) 875-125 MG tablet      Assessment & Plan Acute sinusitis Patient with 2 week hx of frontal sinus pressure and congestion suggesting bacterial sinus infection. History of sinus infections previously that were treated with Augmentin . - Prescribed Augmentin  (amoxicillin /clavulanate) twice daily for one week. - Continue steroid nasal spray. - Instructed to take Augmentin  with food to minimize nausea. - RTC if no improvement within 2 weeks   Meds ordered this encounter  Medications  amoxicillin -clavulanate (AUGMENTIN ) 875-125 MG tablet    Sig: Take 1 tablet by mouth 2 (two) times daily for 7 days.    Dispense:  14 tablet    Refill:  0     No follow-ups on file.      Isaiah DELENA Pepper, MD  [facility] [phone] (phone) [phone] (fax)

## 2024-07-28 ENCOUNTER — Ambulatory Visit
Admission: RE | Admit: 2024-07-28 | Discharge: 2024-07-28 | Disposition: A | Discharge status: Home / Self Care | Payer: PRIVATE HEALTH INSURANCE | Source: Ambulatory Visit | Attending: Family Medicine | Admitting: Family Medicine

## 2024-07-28 DIAGNOSIS — Z0000 Encounter for general adult medical examination without abnormal findings: Secondary | ICD-10-CM

## 2024-07-28 DIAGNOSIS — Z1231 Encounter for screening mammogram for malignant neoplasm of breast: Secondary | ICD-10-CM | POA: Insufficient documentation

## 2024-09-15 ENCOUNTER — Ambulatory Visit: Payer: PRIVATE HEALTH INSURANCE | Admitting: Family Medicine

## 2024-09-15 ENCOUNTER — Encounter: Payer: Self-pay | Admitting: Family Medicine

## 2024-09-15 VITALS — BP 122/74 | HR 67 | Resp 16 | Ht 63.0 in | Wt 181.5 lb

## 2024-09-15 DIAGNOSIS — J302 Other seasonal allergic rhinitis: Secondary | ICD-10-CM | POA: Diagnosis not present

## 2024-09-15 DIAGNOSIS — J3089 Other allergic rhinitis: Secondary | ICD-10-CM | POA: Diagnosis not present

## 2024-09-15 DIAGNOSIS — R519 Headache, unspecified: Secondary | ICD-10-CM

## 2024-09-15 DIAGNOSIS — H9311 Tinnitus, right ear: Secondary | ICD-10-CM

## 2024-09-15 DIAGNOSIS — R0981 Nasal congestion: Secondary | ICD-10-CM | POA: Diagnosis not present

## 2024-09-15 MED ORDER — AZELASTINE HCL 0.1 % NA SOLN: 1.0000 | Freq: Two times a day (BID) | NASAL | 1 refills | Status: AC

## 2024-09-15 NOTE — Progress Notes (Signed)
 Name: Kathy Benjamin   MRN: [id number]    DOB: Aug 16, 1956   Date:09/15/2024       Progress Note  Subjective  Chief Complaint  Chief Complaint  Patient presents with   Sinusitis    Pt believes has sinuses but no sx   Discussed the use of AI scribe software for clinical note transcription with the patient, who gave verbal consent to proceed.  History of Present Illness Kathy Benjamin is a 68 year old female who presents with symptoms of sinus congestion.  She has been experiencing symptoms since November, characterized by a sensation of heaviness in the frontal area of her head. Despite using a nasal spray, she experiences minimal relief, and blowing her nose does not produce much discharge. In November, she was prescribed Augmentin  for acute sinusitis , which she completed, but she still feels congested. No fever, chills, or double vision are present. Occasional dizziness was noted initially but has not recurred since completing the antibiotics.  She has a history of seasonal allergies and is currently taking loratadine  at night and using fluticasone   nasal spray in the morning. She also takes vitamin D  and B12 supplements, though she is unsure of the exact frequency of B12 intake. Her B12 levels were reportedly good in September.  She mentions occasional ringing in her right ear and has a history of vertigo, which she attributes to 'particles getting loose.' She has undergone Epley maneuvers to address this in the past.    Patient Active Problem List   Diagnosis Date Noted   Primary osteoarthritis of both knees 11/20/2023   B12 deficiency 11/20/2023   Other neutropenia 11/14/2021   Osteopenia after menopause 07/02/2018   Urge incontinence of urine 05/06/2017   Post herpetic neuralgia 08/15/2015   Bunion of left foot 05/02/2015   H/O: hysterectomy 05/02/2015   Edema leg 04/29/2015   Obesity (BMI 30.0-34.9) 04/29/2015   Primary localized osteoarthrosis, lower leg 04/29/2015    Perennial allergic rhinitis with seasonal variation 04/29/2015   Pre-diabetes 04/29/2015   Menopausal symptom 04/29/2015   Vitamin D  deficiency 04/29/2015   Abnormal electrocardiogram 04/02/2007    Past Surgical History:  Procedure Laterality Date   ABDOMINAL HYSTERECTOMY     BREAST EXCISIONAL BIOPSY Right 2009   neg   BREAST SURGERY Right [id number]   biopsy   COLONOSCOPY     COLONOSCOPY WITH PROPOFOL  N/A 08/05/2015   Procedure: COLONOSCOPY WITH PROPOFOL ;  Surgeon: Rogelia Copping, MD;  Location: [facility];  Service: Endoscopy;  Laterality: N/A;   HERNIA REPAIR     umbilical    Family History  Problem Relation Age of Onset   Diabetes Mother    Hypertension Mother    Stroke Father    Hypertension Father    Cancer Father    Hyperthyroidism Sister    Kidney disease Sister    Breast cancer Cousin    Cancer Brother        prostate   Hyperthyroidism Brother     Social History   Tobacco Use   Smoking status: Never   Smokeless tobacco: Never  Substance Use Topics   Alcohol use: No    Alcohol/week: 0.0 standard drinks of alcohol    Current Medications[1]  Allergies[2]  I personally reviewed active problem list, medication list, allergies with the patient/caregiver today.   ROS  Ten systems reviewed and is negative except as mentioned in HPI    Objective Physical Exam CONSTITUTIONAL: Patient appears well-developed and well-nourished. No distress. HEENT:  Head atraumatic, normocephalic, neck supple. No sinus tenderness. Normal TM CARDIOVASCULAR: Normal rate, regular rhythm and normal heart sounds. No murmur heard. No BLE edema. PULMONARY: Effort normal and breath sounds normal. No respiratory distress. ABDOMINAL: There is no tenderness or distention. MUSCULOSKELETAL: Normal gait. Without gross motor or sensory deficit. PSYCHIATRIC: Patient has a normal mood and affect. Behavior is normal. Judgment and thought content normal.  Vitals:   09/15/24 0759   BP: 122/74  Pulse: 67  Resp: 16  SpO2: 99%  Weight: 181 lb 8 oz (82.3 kg)  Height: 5' 3 (1.6 m)    Body mass index is 32.15 kg/m.    PHQ2/9:    07/17/2024    1:24 PM 05/25/2024    7:52 AM 11/20/2023    1:01 PM 05/22/2023    7:44 AM 04/08/2023    1:34 PM  Depression screen PHQ 2/9  Decreased Interest 0 0 0 0 0  Down, Depressed, Hopeless 0 0 0 0 0  PHQ - 2 Score 0 0 0 0 0  Altered sleeping 0  0 0   Tired, decreased energy 0  0 0   Change in appetite 0  0 0   Feeling bad or failure about yourself  0  0 0   Trouble concentrating 0  0 0   Moving slowly or fidgety/restless 0  0 0   Suicidal thoughts 0  0 0   PHQ-9 Score 0   0  0    Difficult doing work/chores Not difficult at all  Not difficult at all       Data saved with a previous flowsheet row definition    phq 9 is negative  Fall Risk:    05/25/2024    7:52 AM 11/20/2023    1:01 PM 05/22/2023    7:44 AM 04/08/2023    1:34 PM 12/31/2022   11:31 AM  Fall Risk   Falls in the past year? 0 0 0 0 0  Number falls in past yr: 0 0 0 0 0  Injury with Fall? 0  0  0  0  0   Risk for fall due to : No Fall Risks No Fall Risks No Fall Risks  No Fall Risks  Follow up Falls evaluation completed Falls prevention discussed;Education provided;Falls evaluation completed Falls prevention discussed  Falls prevention discussed;Education provided;Falls evaluation completed     Data saved with a previous flowsheet row definition    Assessment & Plan Allergic rhinitis with sinus congestion and frontal headache Chronic sinus congestion and frontal headache likely due to allergic rhinitis. Symptoms suggest worsening of allergies. - Prescribed azelastine nasal spray, one squirt in each nostril twice a day.  Right-sided tinnitus Intermittent right-sided tinnitus without hearing loss. - Referred to ENT for evaluation.  Vitamin B12 deficiency Previously low levels improved with regular intake. - Advised taking vitamin B12 every other  day.  General health maintenance Up to date with flu vaccination. - Advised obtaining COVID-19 vaccine at pharmacy if desired.        [1]  Current Outpatient Medications:    acetaminophen  (TYLENOL ) 500 MG tablet, Take 1 tablet (500 mg total) by mouth every 6 (six) hours as needed., Disp: 90 tablet, Rfl: 1   Cholecalciferol (VITAMIN D ) 2000 units CAPS, Take 1 capsule by mouth daily., Disp: , Rfl:    fluticasone  (FLONASE ) 50 MCG/ACT nasal spray, SPRAY 2 SPRAYS INTO EACH NOSTRIL EVERY DAY, Disp: 16 mL, Rfl: 4   loratadine  (CLARITIN ) 10 MG tablet, TAKE  1 TABLET BY MOUTH TWICE A DAY, Disp: 180 tablet, Rfl: 0   triamcinolone  cream (KENALOG ) 0.1 %, APPLY TO AFFECTED AREA TWICE A DAY (Patient not taking: Reported on 09/15/2024), Disp: 45 g, Rfl: 0 [2] No Known Allergies

## 2024-11-20 ENCOUNTER — Ambulatory Visit: Payer: PRIVATE HEALTH INSURANCE | Admitting: Family Medicine

## 2024-11-23 ENCOUNTER — Ambulatory Visit: Payer: PRIVATE HEALTH INSURANCE | Admitting: Family Medicine

## 2025-05-25 ENCOUNTER — Ambulatory Visit: Payer: PRIVATE HEALTH INSURANCE | Admitting: Family Medicine
